# Patient Record
Sex: Female | Born: 1940 | Race: Black or African American | Hispanic: No | Smoking: Never smoker
Health system: Southern US, Community
[De-identification: ages and names within clinical notes are randomized; demographics above are authoritative.]

## PROBLEM LIST (undated history)

## (undated) DIAGNOSIS — R0602 Shortness of breath: Secondary | ICD-10-CM

## (undated) DIAGNOSIS — F419 Anxiety disorder, unspecified: Secondary | ICD-10-CM

## (undated) DIAGNOSIS — I4891 Unspecified atrial fibrillation: Secondary | ICD-10-CM

## (undated) DIAGNOSIS — IMO0001 Reserved for inherently not codable concepts without codable children: Secondary | ICD-10-CM

## (undated) DIAGNOSIS — Z950 Presence of cardiac pacemaker: Secondary | ICD-10-CM

## (undated) DIAGNOSIS — I2510 Atherosclerotic heart disease of native coronary artery without angina pectoris: Secondary | ICD-10-CM

## (undated) DIAGNOSIS — E785 Hyperlipidemia, unspecified: Secondary | ICD-10-CM

## (undated) DIAGNOSIS — Z531 Procedure and treatment not carried out because of patient's decision for reasons of belief and group pressure: Secondary | ICD-10-CM

## (undated) DIAGNOSIS — T8859XA Other complications of anesthesia, initial encounter: Secondary | ICD-10-CM

## (undated) DIAGNOSIS — I219 Acute myocardial infarction, unspecified: Secondary | ICD-10-CM

## (undated) DIAGNOSIS — R001 Bradycardia, unspecified: Secondary | ICD-10-CM

## (undated) DIAGNOSIS — T4145XA Adverse effect of unspecified anesthetic, initial encounter: Secondary | ICD-10-CM

## (undated) DIAGNOSIS — D594 Other nonautoimmune hemolytic anemias: Secondary | ICD-10-CM

## (undated) HISTORY — DX: Hyperlipidemia, unspecified: E78.5

## (undated) HISTORY — PX: INSERT / REPLACE / REMOVE PACEMAKER: SUR710

## (undated) HISTORY — DX: Unspecified atrial fibrillation: I48.91

## (undated) HISTORY — DX: Anxiety disorder, unspecified: F41.9

## (undated) HISTORY — DX: Atherosclerotic heart disease of native coronary artery without angina pectoris: I25.10

## (undated) HISTORY — PX: OTHER SURGICAL HISTORY: SHX169

## (undated) HISTORY — DX: Bradycardia, unspecified: R00.1

## (undated) HISTORY — DX: Other nonautoimmune hemolytic anemias: D59.4

## (undated) HISTORY — DX: Acute myocardial infarction, unspecified: I21.9

## (undated) HISTORY — PX: HERNIA REPAIR: SHX51

---

## 2000-05-29 DIAGNOSIS — I219 Acute myocardial infarction, unspecified: Secondary | ICD-10-CM

## 2000-05-29 HISTORY — DX: Acute myocardial infarction, unspecified: I21.9

## 2000-05-29 HISTORY — DX: Other disorders of bilirubin metabolism: E80.6

## 2007-10-06 ENCOUNTER — Inpatient Hospital Stay (HOSPITAL_COMMUNITY): Admission: AD | Admit: 2007-10-06 | Discharge: 2007-10-07 | Payer: Self-pay | Admitting: Cardiovascular Disease

## 2007-10-07 ENCOUNTER — Encounter (INDEPENDENT_AMBULATORY_CARE_PROVIDER_SITE_OTHER): Payer: Self-pay | Admitting: Cardiovascular Disease

## 2008-01-05 ENCOUNTER — Inpatient Hospital Stay (HOSPITAL_COMMUNITY): Admission: EM | Admit: 2008-01-05 | Discharge: 2008-01-06 | Payer: Self-pay | Admitting: Emergency Medicine

## 2008-02-01 ENCOUNTER — Encounter: Admission: RE | Admit: 2008-02-01 | Discharge: 2008-02-01 | Payer: Self-pay | Admitting: Cardiovascular Disease

## 2008-02-07 ENCOUNTER — Ambulatory Visit (HOSPITAL_COMMUNITY): Admission: RE | Admit: 2008-02-07 | Discharge: 2008-02-09 | Payer: Self-pay | Admitting: Cardiovascular Disease

## 2008-07-19 ENCOUNTER — Encounter: Admission: RE | Admit: 2008-07-19 | Discharge: 2008-07-19 | Payer: Self-pay | Admitting: Cardiovascular Disease

## 2009-05-15 ENCOUNTER — Inpatient Hospital Stay (HOSPITAL_COMMUNITY): Admission: AD | Admit: 2009-05-15 | Discharge: 2009-05-18 | Payer: Self-pay | Admitting: Cardiovascular Disease

## 2009-06-08 ENCOUNTER — Encounter: Admission: RE | Admit: 2009-06-08 | Discharge: 2009-06-08 | Payer: Self-pay | Admitting: Cardiovascular Disease

## 2009-10-10 ENCOUNTER — Inpatient Hospital Stay (HOSPITAL_COMMUNITY): Admission: AD | Admit: 2009-10-10 | Discharge: 2009-10-13 | Payer: Self-pay | Admitting: Cardiovascular Disease

## 2009-10-10 ENCOUNTER — Ambulatory Visit: Payer: Self-pay | Admitting: Internal Medicine

## 2009-10-17 ENCOUNTER — Ambulatory Visit: Payer: Self-pay | Admitting: Internal Medicine

## 2009-10-17 ENCOUNTER — Ambulatory Visit (HOSPITAL_COMMUNITY): Admission: RE | Admit: 2009-10-17 | Discharge: 2009-10-18 | Payer: Self-pay | Admitting: Internal Medicine

## 2009-10-18 ENCOUNTER — Encounter: Payer: Self-pay | Admitting: Internal Medicine

## 2009-10-18 ENCOUNTER — Encounter (INDEPENDENT_AMBULATORY_CARE_PROVIDER_SITE_OTHER): Payer: Self-pay | Admitting: *Deleted

## 2009-10-19 ENCOUNTER — Telehealth: Payer: Self-pay | Admitting: Internal Medicine

## 2009-11-01 ENCOUNTER — Ambulatory Visit: Payer: Self-pay

## 2009-11-01 ENCOUNTER — Encounter: Payer: Self-pay | Admitting: Internal Medicine

## 2009-11-02 ENCOUNTER — Ambulatory Visit: Payer: Self-pay | Admitting: Internal Medicine

## 2009-11-02 ENCOUNTER — Ambulatory Visit: Payer: Self-pay | Admitting: Cardiovascular Disease

## 2009-11-02 DIAGNOSIS — Z950 Presence of cardiac pacemaker: Secondary | ICD-10-CM | POA: Insufficient documentation

## 2009-11-02 DIAGNOSIS — R0602 Shortness of breath: Secondary | ICD-10-CM

## 2009-11-02 DIAGNOSIS — I495 Sick sinus syndrome: Secondary | ICD-10-CM

## 2009-11-28 ENCOUNTER — Telehealth: Payer: Self-pay | Admitting: Internal Medicine

## 2009-11-28 DIAGNOSIS — M79609 Pain in unspecified limb: Secondary | ICD-10-CM

## 2009-11-29 ENCOUNTER — Ambulatory Visit: Payer: Self-pay

## 2009-11-29 ENCOUNTER — Ambulatory Visit: Payer: Self-pay | Admitting: Internal Medicine

## 2010-02-01 ENCOUNTER — Encounter (INDEPENDENT_AMBULATORY_CARE_PROVIDER_SITE_OTHER): Payer: Self-pay | Admitting: *Deleted

## 2010-04-23 ENCOUNTER — Telehealth: Payer: Self-pay | Admitting: Internal Medicine

## 2010-05-07 ENCOUNTER — Ambulatory Visit
Admission: RE | Admit: 2010-05-07 | Discharge: 2010-05-07 | Payer: Self-pay | Source: Home / Self Care | Attending: Internal Medicine | Admitting: Internal Medicine

## 2010-05-07 ENCOUNTER — Encounter: Payer: Self-pay | Admitting: Internal Medicine

## 2010-05-07 DIAGNOSIS — I2510 Atherosclerotic heart disease of native coronary artery without angina pectoris: Secondary | ICD-10-CM | POA: Insufficient documentation

## 2010-05-28 NOTE — Cardiovascular Report (Signed)
Summary: Office Visit   Office Visit   Imported By: Roderic Ovens 11/05/2009 10:59:47  _____________________________________________________________________  External Attachment:    Type:   Image     Comment:   External Document

## 2010-05-28 NOTE — Progress Notes (Signed)
Summary: question regarding meds  Phone Note Call from Patient Call back at Home Phone [phone] Call back at [phone]   Caller: Patient Reason for Call: Talk to Nurse Summary of Call: has question regarding Oxycodone AP 5/325 one to two tablets by mouth q.6 h. p.r.n. Initial call taken by: Lorne Skeens,  October 19, 2009 12:32 PM  Follow-up for Phone Call        Pt. with questions regarding oxycodone and metoprolol.  I explained to her that oxycodone was to be taken only every 6 hours as needed for pain and that metoprolol was to be taken on regular basis as prescribed.  I instructed pt for milder pain she could take extra strength tylenol as per med discharge list but that she should not take this in additon to oxycodone. Follow-up by: Dossie Arbour, RN, BSN,  October 19, 2009 12:46 PM

## 2010-05-28 NOTE — Procedures (Signed)
Summary: WOUND CHECK   Current Medications (verified): 1)  None  Allergies (verified): No Known Drug Allergies  Comments:  Nurse/Medical Assistant: Lindsay [name] c/o increasing SOB and fatigue since the implant along with soreness of the site and left arm.  Pacemaker function within normal limits.  No edema or redness of the site or left arm noted.  At the time of discharge she was to start Metoprolol but never filled the Rx.  Her blood pressure today was 127/76.  She will return tomorrow 11/02/09 to follow up with Dr. Graciela Husbands. Lindsay Harm, LPN (November 01, 1608 12:56 PM)  PPM Specifications Following MD:  Lindsay Manges, MD     PPM Vendor:  Medtronic     PPM Model Number:  [id number]     PPM Serial Number:  [id number] Lindsay [name] PPM DOI:  10/17/2009     PPM Implanting MD:  Lindsay Manges, MD  Lead 1    Location: RA     DOI: 10/17/2009     Model #: 1191     Serial #: [id number]     Status: active Lead 2    Location: RV     DOI: 10/17/2009     Model #: [id number]     Serial #: [id number]     Status: active  Magnet Response Rate:  BOL 85 ERI 65  Indications:  Sick sinus syndrome   PPM Follow Up Battery Voltage:  3.10 V       PPM Device Measurements Atrium  Amplitude: 2.7 mV, Impedance: 536 ohms, Threshold: 1.0 V at 0.4 msec Right Ventricle  Amplitude: 14.3 mV, Impedance: 584 ohms, Threshold: 1.0 V at 0.4 msec  Episodes MS Episodes:  0     Percent Mode Switch:  0     Ventricular High Rate:  0     Atrial Pacing:  75.8%     Ventricular Pacing:  0.2%  Parameters Mode:  MVP     Lower Rate Limit:  60     Upper Rate Limit:  130 Paced AV Delay:  180     Sensed AV Delay:  150 Tech Comments:  PT IN FOR DEVICE CHECK--SEE ABOVE NOTE.  NORMAL DEVICE FUNCTION.  NO CHANGES MADE.  PT SCHEDULED TO SEE SK 11-02-09 @ 930.  PT AWARE OF APPT AND ALSO AWARE IF FEELING WORSE CALL 911 AND GO TO ER.  Lindsay [name]  November 01, 2009 1:01 PM

## 2010-05-28 NOTE — Assessment & Plan Note (Signed)
Summary: [email or web address].amber   Referring Provider:  Algie Coffer   History of Present Illness: Lindsay [name] is seen following pacer last week with SOB with some pleurisy  Lindsay [name] is no edema  Current Medications (verified): 1)  Tylenol Extra Strength 500 Mg Tabs (Acetaminophen) .... As Needed For Pain  Allergies (verified): No Known Drug Allergies  Vital Signs:  Patient profile:   70 year old female Height:      69 inches Weight:      190 pounds BMI:     28.16 O2 Sat:      100 % on Room air Pulse rate:   76 / minute Pulse rhythm:   regular BP sitting:   130 / 86  (left arm) Cuff size:   regular  Vitals Entered By: Judithe Modest CMA (November 02, 2009 9:00 AM)  O2 Flow:  Room air  Physical Exam  General:  mild resp distress Head:  nl heent Neck:  flat neck veins Lungs:  clear Heart:  RRR with uout murmurs or gallups Abdomen:  soft non tender Extremities:  no CC edema  Neurologic:  a ox3   EKG  Procedure date:  11/02/2009  Findings:      atrial paced Intervals 0.23 5.09/0.40 No acute ST-T changes Q Waves V1 V2-old  PPM Specifications Following MD:  Sherryl Manges, MD     PPM Vendor:  Medtronic     PPM Model Number:  [id number]     PPM Serial Number:  [id number] PPM DOI:  10/17/2009     PPM Implanting MD:  Sherryl Manges, MD  Lead 1    Location: RA     DOI: 10/17/2009     Model #: [id number]     Serial #: [id number]     Status: active Lead 2    Location: RV     DOI: 10/17/2009     Model #: 5621     Serial #: [id number]     Status: active  Magnet Response Rate:  BOL 85 ERI 65  Indications:  Sick sinus syndrome   Impression & Recommendations:  Problem # 1:  DYSPNEA (ICD-786.05)  the patient has significant dyspnea following her recent hospitalization. The differential diagnosis includes primarily cardiac perforation and cardiac tamponade which I do not see on physical examination, or pulmonary embolism. We will undertake a CT scan with contrast to evaluate the above.  chest x-ray  demonstrated no cardiomegaly but did show some revascularization. CT Scan he showed no pulmonary embolism and no effusion was reported. I discussed with Dr. Arnetha Courser the situation. We will measure a BNP and begin her on Lasix 40 she will followup with him by telephone in the morning Orders: CT Scan  (CT Scan) TLB-BNP (B-Natriuretic Peptide) (83880-BNPR)  Her updated medication list for this problem includes:    Furosemide 40 Mg Tabs (Furosemide) .Marland Kitchen... Take one tablet by mouth daily.  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 This test can show several things, including, whether the heart is enlarged; whether fluid is building up in the lungs; and whether pacemaker / defibrillator leads are still in place. 2)  Non-Cardiac CT scanning, (CAT scanning), is a noninvasive, special x-ray that produces cross-sectional images of the body using x-rays and a computer. CT scans help physicians  diagnose and treat medical conditions. For some CT exams, a contrast material is used to enhance visibility in the area of the body being studied. CT scans provide greater clarity and reveal more details than regular x-ray exams. 3)  Your physician recommends that you return for lab work today. 4)  Your physician has recommended you make the following change in your medication: Start Furosemide 40mg  1 tablet daily. 5)  Call Dr Algie Coffer in the morning to let him know how you are doing. Prescriptions: FUROSEMIDE 40 MG TABS (FUROSEMIDE) Take one tablet by mouth daily.  #30 x 0   Entered by:   Optometrist BSN   Authorized by:   Nathen May, MD, [facility]   Signed by:   Gypsy Balsam RN BSN on 11/02/2009   Method used:   Electronically to        [facility]  (retail)       [address]       [place], [place]  [address]       Ph: [phone]       Fax: [phone]   RxID:   [id number]

## 2010-05-28 NOTE — Progress Notes (Signed)
Summary: pain since procedure in june  Phone Note Call from Patient   Caller: Patient Reason for Call: Talk to Nurse Summary of Call: pt still having pain since procedure in june-upper arm, near shoulder sometimes shooting down to her leg, not at incision site-saw pcp told her to take pain med, she doesn't want to keep doing that pls advise [phone] Initial call taken by: Glynda Jaeger,  November 28, 2009 11:29 AM  Follow-up for Phone Call        I called and spoke with the pt. She states that since her PPM implant in June, she has had pain in her left shoulder that radiates to her neck. The pain will go down her arm to her fingers. She does not notice any tingling, but she does notice some numbness to her hand and her had may feel clammy. The left hand always feels a little cooler than the right. She denies any edema to her arm. She states she saw Dr. Algie Coffer recently and he told her she was fine, but that she should take her pain medication. She does not want to keep taking her pain meds. I will review with Dr. [name] Kill (DOD) and call the pt back. She is agreeable. Follow-up by: Sherri Rad, RN, BSN,  November 28, 2009 11:51 AM  Additional Follow-up for Phone Call Additional follow up Details #1::        I have reviewed the above with Dr. Gala Romney. Orders received to have the pt come tomorrow for an upper extremity venous doppler and a chest x-ray. I have left a message for the pt to call. Sherri Rad, RN, BSN  November 28, 2009 4:09 PM   I spoke with the pt. She is agreeable with coming tomorrow for a venous US and chest x-ray. She is aware her appts. will start at 12:30pm. Additional Follow-up by: Sherri Rad, RN, BSN,  November 28, 2009 4:27 PM  New Problems: ARM PAIN, LEFT (ICD-729.5)   New Problems: ARM PAIN, LEFT (ICD-729.5)

## 2010-05-28 NOTE — Miscellaneous (Signed)
Summary: Device preload  Clinical Lists Changes  Observations: Added new observation of PPM INDICATN: Sick sinus syndrome (10/18/2009 16:09) Added new observation of MAGNET RTE: BOL 85 ERI 65 (10/18/2009 16:09) Added new observation of PPMLEADSTAT2: active (10/18/2009 16:09) Added new observation of PPMLEADSER2: [id number] (10/18/2009 16:09) Added new observation of PPMLEADMOD2: 5086  (10/18/2009 16:09) Added new observation of PPMLEADLOC2: RV  (10/18/2009 16:09) Added new observation of PPMLEADSTAT1: active  (10/18/2009 16:09) Added new observation of PPMLEADSER1: [id number]  (10/18/2009 16:09) Added new observation of PPMLEADMOD1: 5086  (10/18/2009 16:09) Added new observation of PPMLEADLOC1: RA  (10/18/2009 16:09) Added new observation of PPM IMP MD: Sherryl Manges, MD  (10/18/2009 16:09) Added new observation of PPMLEADDOI2: 10/17/2009  (10/18/2009 16:09) Added new observation of PPMLEADDOI1: 10/17/2009  (10/18/2009 16:09) Added new observation of PPM DOI: 10/17/2009  (10/18/2009 16:09) Added new observation of PPM SERL#: [id number]  (10/18/2009 16:09) Added new observation of PPM MODL#: [id number]  (10/18/2009 08:65) Added new observation of PACEMAKERMFG: Medtronic  (10/18/2009 16:09) Added new observation of PACEMAKER MD: Sherryl Manges, MD  (10/18/2009 16:09)      PPM Specifications Following MD:  Sherryl Manges, MD     PPM Vendor:  Medtronic     PPM Model Number:  [id number]     PPM Serial Number:  [id number] PPM DOI:  10/17/2009     PPM Implanting MD:  Sherryl Manges, MD  Lead 1    Location: RA     DOI: 10/17/2009     Model #: 4401     Serial #: [id number]     Status: active Lead 2    Location: RV     DOI: 10/17/2009     Model #: 4034     Serial #: [id number]     Status: active  Magnet Response Rate:  BOL 85 ERI 65  Indications:  Sick sinus syndrome

## 2010-05-28 NOTE — Letter (Signed)
Summary: Appointment - Missed  [facility], Main Office  [address]   [place], [place] [address]   Phone: [phone]  Fax: [phone]     February 01, 2010 MRN: [id number]   [facility] [address] [place], [place]  [address]   Dear Ms. [employer],  Our records indicate you missed your appointment on 01-24-10 with Dr.  Graciela Husbands .                                    It is very important that we reach you to reschedule this appointment. We look forward to participating in your health care needs. Please contact us at the number listed above at your earliest convenience to reschedule this appointment.     Sincerely,    Glass blower/designer

## 2010-05-28 NOTE — Miscellaneous (Signed)
Summary: Orders Update  Clinical Lists Changes  Orders: Added new Test order of T-2 View CXR (71020TC) - Signed 

## 2010-05-30 NOTE — Procedures (Signed)
Summary: Cardiology Device Clinic   Current Medications (verified): 1)  Tylenol Extra Strength 500 Mg Tabs (Acetaminophen) .... As Needed For Pain 2)  Furosemide 40 Mg Tabs (Furosemide) .... Take One Tablet By Mouth Daily.  Allergies (verified): No Known Drug Allergies   PPM Specifications Following MD:  Sherryl Manges, MD     PPM Vendor:  Medtronic     PPM Model Number:  [id number]     PPM Serial Number:  [id number] PPM DOI:  10/17/2009     PPM Implanting MD:  Sherryl Manges, MD  Lead 1    Location: RA     DOI: 10/17/2009     Model #: 1308     Serial #: [id number]     Status: active Lead 2    Location: RV     DOI: 10/17/2009     Model #: 9528     Serial #: [id number]     Status: active  Magnet Response Rate:  BOL 85 ERI 65  Indications:  Sick sinus syndrome   PPM Follow Up Remote Check?  No Battery Voltage:  3.02 V     Pacer Dependent:  No       PPM Device Measurements Atrium  Amplitude: 2.2 mV, Impedance: 488 ohms, Threshold: 0.5 V at 0.4 msec Right Ventricle  Amplitude: 10.8 mV, Impedance: 512 ohms, Threshold: 1.0 V at 0.4 msec  Episodes MS Episodes:  0     Percent Mode Switch:  0     Coumadin:  No Ventricular High Rate:  2     Atrial Pacing:  70.8%     Ventricular Pacing:  0.2%  Parameters Mode:  MVP     Lower Rate Limit:  60     Upper Rate Limit:  130 Paced AV Delay:  180     Sensed AV Delay:  150 Next Cardiology Appt Due:  09/27/2010 Tech Comments:  Outputs reprogrammed for chronic thresholds.  Atrial sensitivity reprogrammed 0.60mV for FFRW.  2VHR episodes 1 second duration 1:1 conduction.  ROV 6/12 with Dr. Graciela Husbands. [name], LPN  May 07, 2010 11:31 AM

## 2010-05-30 NOTE — Assessment & Plan Note (Signed)
Summary: Pacemaker pain/shob   Visit Type:  Follow-up Referring Provider:  Algie [name]  CC:  chest pain / shob.  History of Present Illness: Primary Electrophysiologist:  Dr. Sherryl [name] Primary Cardiologist: Dr. Lahoma Crocker [name] is a 70 yo female with a h/o CAD s/p MI in 2006 treated with a BMS to the LAD followed by a DES to the LAD in 2007 2/2 in stent restenosis.  Last echo in 2009 demonstrated EF 55%, mild MR.  Last nuclear study in 04/2009 demonstrated no ischemia and EF 55%.  Last cath in 09/2009 demonstrated LAD 20% at stent; D2 70%; pCFX 50%; OM 20-30%; RCA 50-60% then 40-50%; EF 50-55%.  She is followed by Dr. Algie [name].  She is s/p pacer implantation 09/2009 due to symptomatic sinus bradycardia.  She saw Dr. Graciela [name] in 10/2009 due to dyspnea.  CXR was ok at that time and CT was negative for pulmonary embolism.  Her BNP was normal at that time at 53.  Lasix was started then.  She called back with continued pain around pacer site and LUE u/s was negative for DVT.  A repeat CXR was also unremarkable.    She was in [place] in Oct and Nov 2011.  She returned to the [place] and was admitted to [facility] in [place], [place] in Dec. 2011.  She states she had several tests and she was told she did not have a "blood clot in her lungs."  She was told she has "fatty tissue in her lungs."  She originally sought care for a URI and was eventually sent home with antibiotics.  She has had chest pain off and on since her cath in 09/2009.  There has been no change.  She has occasional dyspnea.  This can occur at rest or with exertion.  She no longer takes lasix. She denies any further cough.  No pleuritic chest pain.  No syncope.  No fevers.  She sometimes has to sleep on extra pillows and sometimes notes PND.  No increased edema.    She mainly comes in to see [place] today because of continued pain over her pacer site and left shoulder pain.  This is constant and worse with movement.  This has not improved over  time.  She denies complete loss of ROM.  Current Medications (verified): 1)  Tylenol Extra Strength 500 Mg Tabs (Acetaminophen) .... As Needed For Pain 2)  Furosemide 40 Mg Tabs (Furosemide) .... Take One Tablet By Mouth Daily.  Allergies (verified): No Known Drug Allergies  Past History:  Past Medical History: Sinus bradycardia   a. 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  All other systems reviewed and negative.   Vital Signs:  Patient profile:   70 year old female Height:      69 inches Weight:      196 pounds BMI:     29.05 Pulse rate:   62 / minute BP sitting:   118 / 82  (right arm) Cuff size:   regular  Vitals Entered By: Lindsay [name] CNA (May 07, 2010 11:25 AM)  Physical Exam  General:  Well nourished, well developed, in no acute distress HEENT: normal Neck: no JVD Cardiac:  normal S1, S2; RRR; no murmur Lungs:  clear to auscultation bilaterally, no wheezing, rhonchi or rales Abd:  soft, nontender, no hepatomegaly Ext: no edema Vascular: no carotid  bruits Skin: warm and dry Neuro:  CNs 2-12 intact, no focal abnormalities noted MSK: no deformity of left shoulder; no crepitus; no pain over AC joint or biceps tendon; empty can test produces some pain Chest: pacer site without erythema or discharge   EKG  Procedure date:  05/07/2010  Findings:      atrial paced Heart rate 62 Normal axis Nonspecific ST-T wave changes  PPM Specifications Following MD:  Lindsay Manges, MD     PPM Vendor:  Medtronic     PPM Model Number:  [id number]     PPM Serial Number:  [id number] PPM DOI:  10/17/2009     PPM Implanting MD:  Lindsay Manges, MD  Lead 1    Location: RA     DOI: 10/17/2009     Model #: 4098     Serial #: [id number]     Status: active Lead 2    Location: RV     DOI: 10/17/2009     Model #: 5621     Serial #: [id number]     Status:  active  Magnet Response Rate:  BOL 85 ERI 65  Indications:  Sick sinus syndrome   Parameters Mode:  MVP     Lower Rate Limit:  60     Upper Rate Limit:  130 Paced AV Delay:  180     Sensed AV Delay:  150  Impression & Recommendations:  Problem # 1:  ARM PAIN, LEFT (ICD-729.5) She has had an extensive workup in the past.  She has had a chest CT and the left upper extremity ultrasound as well as 2 chest x-rays.  Her exam is benign.  Her pacemaker is working appropriately.  At this point, I suspect she has some type of shoulder pathology creating her symptoms.  I discussed her case with Dr. Gala [name].  We do not recommend any further testing from a cardiac standpoint.  She will be referred to orthopedics.  Orders: Misc. Referral (Misc. Ref)  Problem # 2:  DYSPNEA (ICD-786.05) She does not appear to have signs or symptoms of congestive heart failure on exam.  Her EKG is unchanged.  She has followup with her primary cardiologist tomorrow.  She should keep that appointment.  She apparently was to be referred to a pulmonologist when she was in [place].  She needs to obtain a copy of her records.  We will refer her to primary care.  Further workup can be pursued with primary care and her primary cardiologist.  Orders: Primary Care Referral (Primary)  Problem # 3:  CORONARY ATHEROSCLEROSIS NATIVE CORONARY ARTERY (ICD-414.01) As noted, she will followup with her primary care cardiologist tomorrow. Of note, she had a heart catheterization done in June 2011.  This demonstrated stable anatomy and she was treated medically.  Problem # 4:  CARDIAC PACEMAKER IN SITU (ICD-V45.01) As noted, her pacemaker is functioning appropriately.  Her pacemaker site looks stable without signs of infection.  Other Orders: EKG w/ Interpretation (93000)  Patient Instructions: 1)  Your physician recommends that you schedule a follow-up appointment. To see Dr. Algie [name] tomorrow as scheduled.  2)  Your physician  recommends that you continue on your current medications as directed. Please refer to the Current Medication list given to you today. 3)  You have been referred to follow up with a  primary care doctor. You have also been referred to an Orthopedic specialist for your arm pain.  4)  You will see Dr. Graciela [name] in 6 months. We will send you a reminder in the mail. Your pacemaker device was checked today.

## 2010-05-30 NOTE — Progress Notes (Signed)
Summary: pain around pacermaker  Phone Note Call from Patient Call back at Home Phone [phone]   Caller: Patient Reason for Call: Talk to Nurse Summary of Call: pt states has pain where the pacer maker. pt also has sob. Initial call taken by: Roe Coombs,  April 23, 2010 9:29 AM  Follow-up for Phone Call        spoke to daughter in law and pt in [place]. cannot get a description or rating of pain. Per daughter in law, pt to return to area initially at the end of January and now wants to come back sooner. She also states that pt is not honest about how bad she hurts. Adv her that if pt hurts that bad and shob to seek emergency care. She stated that pt did not want to be seen in both places by two different doctors. Since first appt w.Dr. Graciela Husbands in Feb, offered to have her see Tereso Newcomer and she accepted.  Follow-up by: Claris Gladden RN,  April 23, 2010 4:52 PM

## 2010-06-05 NOTE — Cardiovascular Report (Signed)
Summary: Office Visit   Office Visit   Imported By: Roderic Ovens 05/29/2010 13:52:41  _____________________________________________________________________  External Attachment:    Type:   Image     Comment:   External Document

## 2010-06-26 ENCOUNTER — Ambulatory Visit: Payer: Self-pay | Admitting: Internal Medicine

## 2010-07-14 LAB — CBC
HCT: 34.3 % — ABNORMAL LOW (ref 36.0–46.0)
HCT: 37.2 % (ref 36.0–46.0)
Hemoglobin: 13.1 g/dL (ref 12.0–15.0)
MCHC: 34.6 g/dL (ref 30.0–36.0)
MCHC: 34.7 g/dL (ref 30.0–36.0)
MCHC: 34.1 g/dL (ref 30.0–36.0)
MCHC: 34.4 g/dL (ref 30.0–36.0)
MCHC: 34.6 g/dL (ref 30.0–36.0)
MCV: 96 fL (ref 78.0–100.0)
MCV: 96.2 fL (ref 78.0–100.0)
MCV: 96 fL (ref 78.0–100.0)
Platelets: 198 10*3/uL (ref 150–400)
Platelets: 217 10*3/uL (ref 150–400)
RBC: 3.41 MIL/uL — ABNORMAL LOW (ref 3.87–5.11)
RBC: 4 MIL/uL (ref 3.87–5.11)
RDW: 13.7 % (ref 11.5–15.5)
WBC: 6.1 10*3/uL (ref 4.0–10.5)
WBC: 6.3 10*3/uL (ref 4.0–10.5)

## 2010-07-14 LAB — CARDIAC PANEL(CRET KIN+CKTOT+MB+TROPI)
CK, MB: 1.1 ng/mL (ref 0.3–4.0)
CK, MB: 1.2 ng/mL (ref 0.3–4.0)
Relative Index: INVALID (ref 0.0–2.5)
Relative Index: INVALID (ref 0.0–2.5)
Total CK: 100 U/L (ref 7–177)
Total CK: 77 U/L (ref 7–177)
Troponin I: 0.01 ng/mL (ref 0.00–0.06)
Troponin I: 0.03 ng/mL (ref 0.00–0.06)
Troponin I: 0.03 ng/mL (ref 0.00–0.06)

## 2010-07-14 LAB — APTT: aPTT: 28 seconds (ref 24–37)

## 2010-07-14 LAB — BASIC METABOLIC PANEL
BUN: 13 mg/dL (ref 6–23)
BUN: 14 mg/dL (ref 6–23)
BUN: 13 mg/dL (ref 6–23)
CO2: 24 mEq/L (ref 19–32)
CO2: 25 mEq/L (ref 19–32)
CO2: 21 mEq/L (ref 19–32)
CO2: 26 mEq/L (ref 19–32)
Calcium: 8.9 mg/dL (ref 8.4–10.5)
Calcium: 9.3 mg/dL (ref 8.4–10.5)
Calcium: 9.6 mg/dL (ref 8.4–10.5)
Chloride: 110 mEq/L (ref 96–112)
Chloride: 111 mEq/L (ref 96–112)
Chloride: 111 mEq/L (ref 96–112)
Creatinine, Ser: 1.23 mg/dL — ABNORMAL HIGH (ref 0.4–1.2)
Creatinine, Ser: 1.24 mg/dL — ABNORMAL HIGH (ref 0.4–1.2)
Creatinine, Ser: 1.48 mg/dL — ABNORMAL HIGH (ref 0.4–1.2)
GFR calc Af Amer: 42 mL/min — ABNORMAL LOW (ref >60)
GFR calc Af Amer: 51 mL/min — ABNORMAL LOW (ref >60)
GFR calc Af Amer: 52 mL/min — ABNORMAL LOW (ref >60)
GFR calc non Af Amer: 47 mL/min — ABNORMAL LOW (ref >60)
GFR calc non Af Amer: 35 mL/min — ABNORMAL LOW (ref >60)
Glucose, Bld: 83 mg/dL (ref 70–99)
Glucose, Bld: 87 mg/dL (ref 70–99)
Glucose, Bld: 98 mg/dL (ref 70–99)
Potassium: 3.5 mEq/L (ref 3.5–5.1)
Potassium: 3.7 mEq/L (ref 3.5–5.1)
Potassium: 4 mEq/L (ref 3.5–5.1)
Sodium: 140 mEq/L (ref 135–145)
Sodium: 139 mEq/L (ref 135–145)
Sodium: 140 mEq/L (ref 135–145)

## 2010-07-14 LAB — PROTIME-INR
INR: 1.05 (ref 0.00–1.49)
Prothrombin Time: 13.6 seconds (ref 11.6–15.2)

## 2010-07-14 LAB — LIPID PANEL
Cholesterol: 162 mg/dL (ref 0–200)
HDL: 50 mg/dL (ref >39)
LDL Cholesterol: 100 mg/dL — ABNORMAL HIGH (ref 0–99)
Triglycerides: 94 mg/dL (ref <150)

## 2010-07-14 LAB — DIFFERENTIAL
Basophils Relative: 1 % (ref 0–1)
Basophils Relative: 1 % (ref 0–1)
Eosinophils Absolute: 0.1 10*3/uL (ref 0.0–0.7)
Eosinophils Absolute: 0.1 10*3/uL (ref 0.0–0.7)
Eosinophils Relative: 2 % (ref 0–5)
Eosinophils Relative: 2 % (ref 0–5)
Lymphocytes Relative: 40 % (ref 12–46)
Lymphs Abs: 2.8 10*3/uL (ref 0.7–4.0)
Monocytes Absolute: 0.5 10*3/uL (ref 0.1–1.0)
Monocytes Relative: 7 % (ref 3–12)
Monocytes Relative: 8 % (ref 3–12)
Neutro Abs: 3.4 10*3/uL (ref 1.7–7.7)
Neutrophils Relative %: 50 % (ref 43–77)

## 2010-07-14 LAB — CK TOTAL AND CKMB (NOT AT ARMC): CK, MB: 1.4 ng/mL (ref 0.3–4.0)

## 2010-09-03 ENCOUNTER — Inpatient Hospital Stay (HOSPITAL_COMMUNITY)
Admission: EM | Admit: 2010-09-03 | Discharge: 2010-09-05 | DRG: 313 | Disposition: A | Discharge status: Home / Self Care | Payer: Medicare Other | Source: Ambulatory Visit | Attending: Cardiovascular Disease | Admitting: Cardiovascular Disease

## 2010-09-03 ENCOUNTER — Emergency Department (HOSPITAL_COMMUNITY): Payer: Medicare Other

## 2010-09-03 DIAGNOSIS — Z7902 Long term (current) use of antithrombotics/antiplatelets: Secondary | ICD-10-CM

## 2010-09-03 DIAGNOSIS — I2510 Atherosclerotic heart disease of native coronary artery without angina pectoris: Secondary | ICD-10-CM | POA: Diagnosis present

## 2010-09-03 DIAGNOSIS — Z7982 Long term (current) use of aspirin: Secondary | ICD-10-CM

## 2010-09-03 DIAGNOSIS — E785 Hyperlipidemia, unspecified: Secondary | ICD-10-CM | POA: Diagnosis present

## 2010-09-03 DIAGNOSIS — R0789 Other chest pain: Principal | ICD-10-CM | POA: Diagnosis present

## 2010-09-03 DIAGNOSIS — Z9861 Coronary angioplasty status: Secondary | ICD-10-CM

## 2010-09-03 LAB — CBC
Hemoglobin: 12.2 g/dL (ref 12.0–15.0)
MCH: 32 pg (ref 26.0–34.0)
MCHC: 34.6 g/dL (ref 30.0–36.0)
RDW: 13.2 % (ref 11.5–15.5)

## 2010-09-03 LAB — BASIC METABOLIC PANEL
Calcium: 9.7 mg/dL (ref 8.4–10.5)
Chloride: 108 mEq/L (ref 96–112)
Creatinine, Ser: 1.11 mg/dL (ref 0.4–1.2)
GFR calc Af Amer: 59 mL/min — ABNORMAL LOW (ref >60)
Sodium: 141 mEq/L (ref 135–145)

## 2010-09-03 LAB — DIFFERENTIAL
Basophils Absolute: 0 10*3/uL (ref 0.0–0.1)
Basophils Relative: 0 % (ref 0–1)
Monocytes Relative: 9 % (ref 3–12)
Neutro Abs: 3.7 10*3/uL (ref 1.7–7.7)
Neutrophils Relative %: 53 % (ref 43–77)

## 2010-09-03 LAB — CK TOTAL AND CKMB (NOT AT ARMC): Total CK: 100 U/L (ref 7–177)

## 2010-09-03 LAB — HEPARIN LEVEL (UNFRACTIONATED): Heparin Unfractionated: 0.31 IU/mL (ref 0.30–0.70)

## 2010-09-03 LAB — TROPONIN I: Troponin I: <0.3 ng/mL (ref <0.30)

## 2010-09-03 LAB — POCT CARDIAC MARKERS: Myoglobin, poc: 85.6 ng/mL (ref 12–200)

## 2010-09-04 LAB — BASIC METABOLIC PANEL
CO2: 26 mEq/L (ref 19–32)
Chloride: 108 mEq/L (ref 96–112)
GFR calc Af Amer: 51 mL/min — ABNORMAL LOW (ref >60)
Sodium: 140 mEq/L (ref 135–145)

## 2010-09-04 LAB — CBC
HCT: 35 % — ABNORMAL LOW (ref 36.0–46.0)
MCHC: 32.9 g/dL (ref 30.0–36.0)
MCV: 93.3 fL (ref 78.0–100.0)
Platelets: 215 10*3/uL (ref 150–400)
RDW: 13.4 % (ref 11.5–15.5)

## 2010-09-04 LAB — CARDIAC PANEL(CRET KIN+CKTOT+MB+TROPI)
CK, MB: 1.7 ng/mL (ref 0.3–4.0)
Relative Index: INVALID (ref 0.0–2.5)
Total CK: 91 U/L (ref 7–177)
Troponin I: <0.3 ng/mL (ref <0.30)

## 2010-09-04 LAB — LIPID PANEL
Cholesterol: 179 mg/dL (ref 0–200)
HDL: 50 mg/dL (ref >39)
Total CHOL/HDL Ratio: 3.6 RATIO
Triglycerides: 95 mg/dL (ref <150)
VLDL: 19 mg/dL (ref 0–40)

## 2010-09-05 LAB — CBC
HCT: 37.7 % (ref 36.0–46.0)
Hemoglobin: 12.6 g/dL (ref 12.0–15.0)
MCH: 31 pg (ref 26.0–34.0)
MCV: 92.9 fL (ref 78.0–100.0)
RBC: 4.06 MIL/uL (ref 3.87–5.11)

## 2010-09-05 LAB — HEPARIN LEVEL (UNFRACTIONATED): Heparin Unfractionated: 0.69 IU/mL (ref 0.30–0.70)

## 2010-09-07 NOTE — Discharge Summary (Signed)
NAME:  Lindsay [name], Lindsay [name]            ACCOUNT NO.:  [id number]  MEDICAL RECORD NO.:  [id number]           PATIENT TYPE:  I  LOCATION:  3738                         FACILITY:  [facility]  PHYSICIAN:  Ricki Rodriguez, M.D.  DATE OF BIRTH:  1940-05-04  DATE OF ADMISSION:  09/03/2010 DATE OF DISCHARGE:  09/05/2010                              DISCHARGE SUMMARY   FINAL DIAGNOSES: 1. Chest pain. 2. Coronary artery disease. 3. Status post angioplasty of coronary vessel. 4. Hyperlipidemia.  DISCHARGE MEDICATIONS: 1. Metoprolol succinate 25 mg one daily. 2. Oxycodone 5/325 mg one or two tablets daily as needed. 3. Potassium chloride 10 mEq one daily. 4. Aspirin 81 mg daily. 5. Meclizine 12.5 mg a day. 6. Nexium 40 mg daily. 7. Nitroglycerin 0.4 mg sublingual tablet one under the tongue every 5     minutes as needed up to 3 doses as needed. 8. Plavix 75 mg daily. 9. Simvastatin 20 mg one in the evening.  DISCHARGE DIET:  Low-sodium heart-healthy diet.  DISCHARGE ACTIVITY:  The patient to increase activity as tolerated.  CONDITION ON DISCHARGE:  Improved.  FOLLOWUP:  By Dr. Orpah Cobb in 2 weeks.  HISTORY:  This 70 year old black female presented with a sudden onset of chest pain described as sharp and nonradiating without any nausea or vomiting but with sweating spell.  PHYSICAL EXAMINATION:  GENERAL:  The patient is a well-built and well- nourished female in mild distress. VITAL SIGNS:  Temperature 98, pulse 62, respirations 20, blood pressure 164/99. HEENT:  The patient is normocephalic and atraumatic with dark brown eyes.  Conjunctivae pink.  Sclerae white.  Throat pink.  The patient wears reading glasses and has dentures, but does not wear them due to poor fitting. NECK:  No JVD, no carotid bruit. LUNGS:  Clear bilaterally.  Chest wall slightly tender on palpation over the left precordium. HEART:  Normal S1 and S2. ABDOMEN:  Soft and nontender. EXTREMITIES:  No edema,  cyanosis, or clubbing. NEUROLOGIC:  Cranial nerves grossly intact.  Bilateral equal grips.  The patient is right-handed. SKIN:  Warm and dry.  LABORATORY DATA:  Normal hemoglobin/hematocrit, WBC count, platelet count.  Near normal electrolytes except potassium low at 3.2, normal BUN, creatinine, and glucose.  Subsequent potassium was 4.0, magnesium level 2.6.  CK-MB, troponin I negative x3.    EKG showed normal sinus rhythm with possible septal infarct.    Chest x-ray, no active cardiopulmonary disease.  HOSPITAL COURSE:  The patient was admitted to telemetry unit. Myocardial infarction was ruled out.  Her previous cardiac catheterization in June 2011 and nuclear stress test in January 2011 were reviewed.  The patient also claimed she had recent nuclear stress test done in a hospital in [place].  She refused further testing and had significant improvement with oral analgesic.  She was discharged home in satisfactory condition and will have outpatient testing as and when the patient is ready.  She was also advised to get mammogram done.     Ricki Rodriguez, M.D.     ASK/MEDQ  D:  09/05/2010  T:  09/05/2010  Job:  [id number]  Electronically Signed  by Orpah Cobb M.D. on 09/07/2010 02:23:37 PM

## 2010-09-10 NOTE — Discharge Summary (Signed)
NAME:  Lindsay [name], Lindsay [name]            ACCOUNT NO.:  [id number]   MEDICAL RECORD NO.:  [id number]          PATIENT TYPE:  INP   LOCATION:  2023                         FACILITY:  [facility]   PHYSICIAN:  Ricki Rodriguez, M.D.  DATE OF BIRTH:  12-Dec-1940   DATE OF ADMISSION:  01/05/2008  DATE OF DISCHARGE:  01/06/2008                               DISCHARGE SUMMARY   FINAL DIAGNOSES:  1. Coronary artery disease, native vessel.  2. Chest pain.  3. Status post angioplasty of coronary vessels.  4. Anxiety.  5. Hyperlipidemia.   DISCHARGE MEDICATIONS:  1. Nitroglycerin 0.4 mg tablet one sublingual every 5 minutes x3 as      needed for chest pain and as directed.  2. Aspirin 81 mg one daily in the morning.  3. Plavix 75 mg one daily in the morning.  4. Nexium 40 mg one daily in the morning.  5. Multivitamin one daily in the evening.  6. Simvastatin 20 mg one daily in the evening.  7. Amlodipine 2.5 mg one daily in the morning.   FOLLOWUP:  By Dr. Orpah Cobb in 1-2 weeks.  The patient is to call [phone] for appointment.   DISCHARGE DIET:  Low-sodium heart-healthy diet.   DISCHARGE ACTIVITY:  The patient to increase activity slowly.   HISTORY:  This is a 70 year old black female with known history of  coronary artery disease, had chest pain x 2 days, described as heaviness  across the chest, and also had one-sided headache starting from the back  of the neck.  Her stress test 3 months ago was without any reversible  ischemia.   PAST MEDICAL HISTORY:  Positive for myocardial infarction with  angioplasty x2 in last 3-4 years.   PHYSICAL EXAMINATION:  VITAL SIGNS:  Pulse 48, respirations 22, blood  pressure 149/91, temperature 97.4, and weight 185 pounds.  GENERAL:  The patient is averagely built and nourished black female in  mild distress.  HEENT:  The patient is normocephalic and atraumatic with brown eyes.  Pupils equally reactive to light.  Extraocular movement intact.  Ear,  nose, and throat grossly unremarkable.  NECK:  Supple.  LYMPHADENOPATHY:  None.  LUNGS:  Clear to auscultation.  HEART:  Normal S1 and S2.  No S3, grade 2/6 systolic murmur.  ABDOMEN:  Soft and nontender.  EXTREMITIES:  No edema, cyanosis, or clubbing.  NEUROLOGICALLY:  The patient was alert and oriented x 3.  Cranial nerves  grossly intact and the patient is a right-handed.   LABORATORY DATA:  Normal hemoglobin, hematocrit, WBC count, and platelet  count.  Normal electrolytes, BUN, and creatinine.  Normal CK-MB and  troponin I.   EKG was sinus bradycardia with first-degree AV block.   CT of the brain was without any acute intracranial abnormality.   HOSPITAL COURSE:  The patient was admitted to telemetry unit.  Myocardial infarction was ruled out.  She was given choice of cardiac  catheterization followed by permanent pacemaker placement.  However, the  patient to decline both the procedures.  She requested me to discharge  her home, and she will follow up  with me in 2 weeks.  At which time, she  may have made up her mind to decide which procedure she will go for.  In  the meantime, we will hold beta-blocker due to her bradycardia and first-  degree AV block, and she was started on generic simvastatin at lower  dose then in the past, presently has not taken those medications which I  am sure it was prescribed at some time. She was discharged home in  satisfactory condition.      Ricki Rodriguez, M.D.  Electronically Signed     ASK/MEDQ  D:  01/06/2008  T:  01/07/2008  Job:  [id number]

## 2010-09-10 NOTE — H&P (Signed)
NAME:  Lindsay [name], Lindsay [name] NO.:  [id number]   MEDICAL RECORD NO.:  [id number]          PATIENT TYPE:  INP   LOCATION:  3729                         FACILITY:  [facility]   PHYSICIAN:  Ricki Rodriguez, M.D.  DATE OF BIRTH:  Apr 25, 1941   DATE OF ADMISSION:  10/06/2007  DATE OF DISCHARGE:                              HISTORY & PHYSICAL   CHIEF COMPLAINT:  Chest pain.   HISTORY OF PRESENT ILLNESS:  This 70 year old black female presented to  the office with shortness of breath and occasional sharp chest pain.  She also admitted to some weight gain.  She has a history of myocardial  infarction in 2006 with a stent placement in left anterior descending  coronary artery.  A year later, she had a drug eluting Cypher stent  placed for recurrent stenosis.   PAST MEDICAL HISTORY:  Negative for diabetes.  Positive for myocardial  infarction.  Negative for hypertension.  Negative for smoking, alcohol  intake, or drug use.   PAST SURGICAL HISTORY:  The patient had duodenal ulcer and left inguinal  hernia repair.   MEDICATIONS:  1. Tylenol 500 mg 2 daily as needed.  2. Aspirin 81 mg 1 daily.  3. Nexium 40 mg 1 daily.  4. Plavix 75 mg 1 daily.   ALLERGIES:  No known drug allergy.   PERSONAL HISTORY:  The patient is widowed, works as a Chief Strategy Officer,  took care of husband last 4 years before he died in 08/11/07.   FAMILY HISTORY:  Mother died of diabetes mellitus at age 55.  Father not  known.  She had 6 sisters, all are living and 4 brothers, 3 of whom have  died, one died in auto accident, only one brother is living.   REVIEW OF SYSTEMS:  No history of asthma, COPD, pneumonia, or positive  history of chest pain.  No palpitation.  Positive history of GI bleed.  No history of kidney stones.  No cerebrovascular accident.  No seizures.  Psychiatric admissions.  Positive history of joint pains.   PHYSICAL EXAMINATION:  VITAL SIGNS:  Pulse 51, respirations 14, blood  pressure  95/55, height 5 feet 9 inches, weight 187 pounds, and BMI 27.5.  GENERAL:  The patient is well-built, well-nourished black female in mild  respiratory distress.  HEENT:  The patient is normocephalic atraumatic, has brown eyes.  Conjunctivae pink.  Sclerae white.  Throat pink.  The patient wears  reading glasses and has full upper denture, but does not wear them due  to difficulty in breathing.  NECK:  No JVD.  No bruits.  Has full range of motion.  LUNGS:  Clear bilaterally.  HEART:  Normal S1, S2.  ABDOMEN:  Soft and nontender.  EXTREMITY:  Trace edema.  No cyanosis or clubbing.  NEUROLOGIC:  Cranial nerves grossly intact.  Bilateral equal grips.  The  patient is right handed.  SKIN:  Warm and dry.   LABORATORY DATA:  Normal hemoglobin, hematocrit, WBC count, and platelet  count.  Normal electrolytes.  Cardiac enzymes within normal range.   EKG, sinus bradycardia, left ventricular hypertrophy with a  nonspecific  ST-T changes.   IMPRESSION:  1. Chest pain, rule out myocardial infarction.  2. Old myocardial infarction.  3. Status post stent placement.  4. Coronary artery disease.  5. Duodenal ulcer.   PLAN:  To admit the patient to telemetry bed.  Rule out MI.  Consider  nuclear stress test; 2-D echocardiogram.      Ricki Rodriguez, M.D.  Electronically Signed     ASK/MEDQ  D:  10/06/2007  T:  10/07/2007  Job:  [id number]

## 2010-09-10 NOTE — Discharge Summary (Signed)
NAME:  Lindsay [name], Lindsay [name]            ACCOUNT NO.:  [id number]   MEDICAL RECORD NO.:  [id number]          PATIENT TYPE:  INP   LOCATION:  3729                         FACILITY:  [facility]   PHYSICIAN:  Ricki Rodriguez, M.D.  DATE OF BIRTH:  12/03/1940   DATE OF ADMISSION:  10/06/2007  DATE OF DISCHARGE:  10/07/2007                               DISCHARGE SUMMARY   FINAL DIAGNOSES:  1. Chest pain.  2. Cardiac dysrhythmia.  3. Coronary atherosclerosis of native coronary vessel.  4. Chronic obstructive asthma.  5. Percutaneous transluminal coronary angioplasty status.  6. Old myocardial infarction.   DISCHARGE MEDICATIONS:  1. Aspirin 81 mg 1 daily.  2. Plavix 75 mg 1 daily.  3. Nexium 40 mg 1 daily.  4. Lisinopril 2.5 mg 1 daily.   DISCHARGE DIET:  Low-sodium, heart-healthy diet.   WOUND CARE:  Not applicable.   SPECIFIC INSTRUCTIONS:  The patient to stop any activity that causes  chest pain, shortness of breath, dizziness, sweating, or excessive  weakness.   DISCHARGE ACTIVITY:  The patient to increase activity slowly.   FOLLOWUP:  Dr. Orpah Cobb in 1 month.  The patient to call [phone]  for appointment.   HISTORY:  This 70 year old black female who presented with shortness of  breath and occasional sharp pain.  The patient had a history of  myocardial infarction in 2006, with stent placement in left anterior  descending coronary artery.  This was then followed by a drug-eluting  stent placement for recurrent stenosis.   PHYSICAL EXAMINATION:  VITAL SIGNS:  Pulse rate 51, respirations 14,  blood pressure 95/55, height 5 feet 9 inches, weight 187 pounds, and BMI  of 27.5.  GENERAL:  The patient is well-built, well-nourished black female in mild  respiratory distress.  HEENT:  The patient is normocephalic and atraumatic with brown eyes.  Conjunctivae pink.  Sclerae nonicteric.  The patient wears reading  glasses.  She has full upper dentures, but does not wear them due to  difficulty in breathing.  NECK:  No JVD.  No bruit with full range of motion.  LUNGS:  Clear bilaterally.  HEART:  Normal S1 and S2.  ABDOMEN:  Soft and nontender.  EXTREMITIES:  Trace edema.  No cyanosis or clubbing.  NEUROLOGIC:  Cranial nerves grossly intact.  Bilateral equal grips.  The  patient is right handed.   LABORATORY DATA:  Normal hemoglobin, hematocrit, WBC count, and platelet  count.  Normal electrolytes.  Cardiac enzymes within normal range.   EKG, sinus bradycardia, left ventricular hypertrophy with nonspecific ST-  T changes.  Nuclear stress test without any perfusion defect.   Echocardiogram showed pseudotendon running across the mid septum  otherwise preserved left ventricular systolic function and mild  hypokinesia of the mid septal wall and mild mitral regurgitation.   HOSPITAL COURSE:  The patient was admitted to telemetry unit.  Myocardial infarction was ruled out.  She underwent nuclear stress test  that failed to show any reversible ischemia.  Her overall condition  being stable,  she was discharged home in satisfactory condition. Her  beta-blocker therapy was not continued due  to bradycardia.      Ricki Rodriguez, M.D.  Electronically Signed     ASK/MEDQ  D:  12/27/2007  T:  12/28/2007  Job:  [id number]

## 2010-10-28 ENCOUNTER — Telehealth: Payer: Self-pay | Admitting: Internal Medicine

## 2010-10-28 NOTE — Telephone Encounter (Signed)
Pt calling re appt on 7-23, but having pain, hardness in shoulder

## 2010-10-28 NOTE — Telephone Encounter (Signed)
LMTC

## 2010-10-31 NOTE — Telephone Encounter (Signed)
LMTC

## 2010-11-01 NOTE — Telephone Encounter (Signed)
The patient's family answered at the number that was listed. He states that the patient is at a different location and he does not know the number. I have asked him to just have her give [place] a call back on Monday.

## 2010-11-12 NOTE — Telephone Encounter (Signed)
No response from the patient. I will close this encounter. 

## 2010-11-14 ENCOUNTER — Encounter: Payer: Self-pay | Admitting: Internal Medicine

## 2010-11-18 ENCOUNTER — Ambulatory Visit (INDEPENDENT_AMBULATORY_CARE_PROVIDER_SITE_OTHER): Payer: Federal, State, Local not specified - PPO | Admitting: Internal Medicine

## 2010-11-18 ENCOUNTER — Encounter: Payer: Self-pay | Admitting: Internal Medicine

## 2010-11-18 DIAGNOSIS — R040 Epistaxis: Secondary | ICD-10-CM | POA: Insufficient documentation

## 2010-11-18 DIAGNOSIS — I495 Sick sinus syndrome: Secondary | ICD-10-CM

## 2010-11-18 DIAGNOSIS — I2510 Atherosclerotic heart disease of native coronary artery without angina pectoris: Secondary | ICD-10-CM

## 2010-11-18 DIAGNOSIS — Z950 Presence of cardiac pacemaker: Secondary | ICD-10-CM

## 2010-11-18 DIAGNOSIS — M79609 Pain in unspecified limb: Secondary | ICD-10-CM

## 2010-11-18 DIAGNOSIS — I4891 Unspecified atrial fibrillation: Secondary | ICD-10-CM | POA: Insufficient documentation

## 2010-11-18 NOTE — Progress Notes (Signed)
  HPI  Lindsay [name] is a 70 y.o. female Seen in followup for pacemaker implanted June 2011 for symptomatic sinus node dysfunction. Her postoperative course was complicated by discomfort and pleuritic pain for which no specific cause was ever a loose today. The symptoms have largely but not completely abated.  She continues to have episodic dyspnea. She has had no palpitations.  She has complaints of orthostatic intolerance.  Past Medical History  Diagnosis Date  . Sinus bradycardia     s/p pacer in 6.2011  . CAD (coronary artery disease)     s/p BMS to LAD in 06  . MI (myocardial infarction) 2/2  . 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 Anxiety   . HLD (hyperlipidemia)     Past Surgical History  Procedure Date  . Angioplasty of coronary vessel     Current Outpatient Prescriptions  Medication Sig Dispense Refill  . acetaminophen (TYLENOL) 500 MG tablet Take 500 mg by mouth as needed.        Marland Kitchen aspirin 81 MG tablet Take 81 mg by mouth daily.        . clopidogrel (PLAVIX) 75 MG tablet Take 75 mg by mouth daily.        . nitroGLYCERIN (NITROSTAT) 0.4 MG SL tablet Place 0.4 mg under the tongue every 5 (five) minutes as needed.        Marland Kitchen esomeprazole (NEXIUM) 40 MG capsule Take 40 mg by mouth as needed.         No Known Allergies  Review of Systems negative except from HPI and PMH  Physical Exam Well developed and well nourished in no acute distress HENT normal E scleral and icterus clear Neck Supple JVP flat; carotids brisk and full Clear to ausculation Device pocket is well healed Regular rate and rhythm, no murmurs gallops or rub Soft with active bowel sounds No clubbing cyanosis and edema Somewhat limited mobility of her left shoulder Alert and oriented, grossly normal motor and sensory function Skin Warm and Dry    Assessment and  Plan

## 2010-11-18 NOTE — Assessment & Plan Note (Addendum)
Much improved. I suggested that she continue range of motion exercises For her left arm

## 2010-11-18 NOTE — Patient Instructions (Signed)
Your physician wants you to follow-up in: 6 months  You will receive a reminder letter in the mail two months in advance. If you don't receive a letter, please call our office to schedule the follow-up appointment.  Your physician recommends that you continue on your current medications as directed. Please refer to the Current Medication list given to you today.  

## 2010-11-18 NOTE — Assessment & Plan Note (Signed)
Ongoing problems with nonexertional likely noncardiac chest pain area and she does not take her Plavix regularly but she is now 3 years out and perhaps does not need it any longer. I will defer this to Dr. Arnetha Courser. She has had positive at epistaxis and she does not take her Plavix.

## 2010-11-18 NOTE — Assessment & Plan Note (Signed)
This is been identified on her pacemaker is less than One days duration. We'll need to follow this as her CHADS VASC score would be an indication for oral anticoagulation see below

## 2010-11-18 NOTE — Assessment & Plan Note (Signed)
Patient has epistaxis in the setting of her Plavix therapy and she doesn't take it. I recommended that she followup with Dr. Arnetha Courser for consideration of ENT referral as anticoagulation will clearly need to be part of her future

## 2010-11-18 NOTE — Assessment & Plan Note (Signed)
75% atrially paced

## 2010-11-18 NOTE — Assessment & Plan Note (Signed)
The patient's device was interrogated.  The information was reviewed. No changes were made in the programming.    

## 2011-01-07 ENCOUNTER — Other Ambulatory Visit: Payer: Self-pay | Admitting: Cardiovascular Disease

## 2011-01-07 DIAGNOSIS — Z1231 Encounter for screening mammogram for malignant neoplasm of breast: Secondary | ICD-10-CM

## 2011-01-10 ENCOUNTER — Ambulatory Visit
Admission: RE | Admit: 2011-01-10 | Discharge: 2011-01-10 | Disposition: A | Discharge status: Home / Self Care | Payer: Federal, State, Local not specified - PPO | Source: Ambulatory Visit | Attending: Cardiovascular Disease | Admitting: Cardiovascular Disease

## 2011-01-10 DIAGNOSIS — Z1231 Encounter for screening mammogram for malignant neoplasm of breast: Secondary | ICD-10-CM

## 2011-01-16 ENCOUNTER — Ambulatory Visit (INDEPENDENT_AMBULATORY_CARE_PROVIDER_SITE_OTHER): Payer: Federal, State, Local not specified - PPO | Admitting: Internal Medicine

## 2011-01-16 ENCOUNTER — Emergency Department (HOSPITAL_COMMUNITY): Payer: Medicare Other

## 2011-01-16 ENCOUNTER — Encounter: Payer: Self-pay | Admitting: Internal Medicine

## 2011-01-16 ENCOUNTER — Inpatient Hospital Stay (HOSPITAL_COMMUNITY)
Admission: EM | Admit: 2011-01-16 | Discharge: 2011-01-17 | DRG: 313 | Disposition: A | Discharge status: Home / Self Care | Payer: Medicare Other | Source: Ambulatory Visit | Attending: Cardiovascular Disease | Admitting: Cardiovascular Disease

## 2011-01-16 DIAGNOSIS — E7800 Pure hypercholesterolemia, unspecified: Secondary | ICD-10-CM | POA: Diagnosis present

## 2011-01-16 DIAGNOSIS — I2510 Atherosclerotic heart disease of native coronary artery without angina pectoris: Secondary | ICD-10-CM | POA: Diagnosis present

## 2011-01-16 DIAGNOSIS — Z950 Presence of cardiac pacemaker: Secondary | ICD-10-CM

## 2011-01-16 DIAGNOSIS — Z79899 Other long term (current) drug therapy: Secondary | ICD-10-CM

## 2011-01-16 DIAGNOSIS — I10 Essential (primary) hypertension: Secondary | ICD-10-CM | POA: Diagnosis present

## 2011-01-16 DIAGNOSIS — I4891 Unspecified atrial fibrillation: Secondary | ICD-10-CM

## 2011-01-16 DIAGNOSIS — K219 Gastro-esophageal reflux disease without esophagitis: Secondary | ICD-10-CM | POA: Diagnosis present

## 2011-01-16 DIAGNOSIS — Z9861 Coronary angioplasty status: Secondary | ICD-10-CM

## 2011-01-16 DIAGNOSIS — Z7902 Long term (current) use of antithrombotics/antiplatelets: Secondary | ICD-10-CM

## 2011-01-16 DIAGNOSIS — R0602 Shortness of breath: Secondary | ICD-10-CM

## 2011-01-16 DIAGNOSIS — R0789 Other chest pain: Principal | ICD-10-CM | POA: Diagnosis present

## 2011-01-16 DIAGNOSIS — Z7982 Long term (current) use of aspirin: Secondary | ICD-10-CM

## 2011-01-16 DIAGNOSIS — I252 Old myocardial infarction: Secondary | ICD-10-CM

## 2011-01-16 LAB — CBC
Hemoglobin: 13.4 g/dL (ref 12.0–15.0)
MCH: 32.2 pg (ref 26.0–34.0)
RBC: 4.16 MIL/uL (ref 3.87–5.11)

## 2011-01-16 LAB — DIFFERENTIAL
Basophils Relative: 1 % (ref 0–1)
Eosinophils Absolute: 0.1 10*3/uL (ref 0.0–0.7)
Lymphs Abs: 2.8 10*3/uL (ref 0.7–4.0)
Neutrophils Relative %: 43 % (ref 43–77)

## 2011-01-16 LAB — BASIC METABOLIC PANEL
BUN: 15 mg/dL (ref 6–23)
Chloride: 109 mEq/L (ref 96–112)
GFR calc Af Amer: 52 mL/min — ABNORMAL LOW (ref >60)
Potassium: 3.1 mEq/L — ABNORMAL LOW (ref 3.5–5.1)

## 2011-01-16 LAB — TROPONIN I: Troponin I: <0.3 ng/mL (ref <0.30)

## 2011-01-16 LAB — PACEMAKER DEVICE OBSERVATION
BAMS-0001: 170 {beats}/min
BATTERY VOLTAGE: 3 V
RV LEAD AMPLITUDE: 14.3 mv

## 2011-01-16 LAB — CK TOTAL AND CKMB (NOT AT ARMC)
Relative Index: 2.1 (ref 0.0–2.5)
Total CK: 105 U/L (ref 7–177)

## 2011-01-16 LAB — POCT I-STAT TROPONIN I: Troponin i, poc: 0.02 ng/mL (ref 0.00–0.08)

## 2011-01-16 NOTE — Assessment & Plan Note (Signed)
No significant AFb

## 2011-01-16 NOTE — Assessment & Plan Note (Signed)
The patient's device was interrogated.  The information was reviewed. No changes were made in the programming.    

## 2011-01-16 NOTE — Assessment & Plan Note (Signed)
There is no evidence of an acute ST segment elevation MI. I am concerned about acute coronary syndrome. We'll send her to the emergency room

## 2011-01-16 NOTE — Progress Notes (Signed)
  HPI  Lindsay [name] is a 70 y.o. female Seen as an add-on today because of chest pain and shortness of breath. She saw dr Arnetha Courser last week who increased her NTG   She has continued to have symptoms however . He has had increasing problems with breathing. She is short of breath walking as well as talking. She's had left-sided chest pain with radiation into her arm. This is reminiscent of symptoms that she had prior to her stenting. She has had no peripheral edema. She is taking no long trips. She called our office because she thought maybe it was her pacemaker. This visit was arranged She has a pacemaker implanted June 2011 for symptomatic sinus node dysfunction. Her postoperative course was complicated by discomfort and pleuritic pain for which no specific cause was ever a loose today. The symptoms have largely but not completely abated.  S   Past Medical History  Diagnosis Date  . Sinus bradycardia     s/p pacer in 6.2011  . CAD (coronary artery disease)     s/p BMS to LAD in 06; Drug-eluting stent for in-stent restenosis 2007; modest residual disease cath 2011  . MI (myocardial infarction) 2/2  . 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 Anxiety   . HLD (hyperlipidemia)   . Atrial fibrillation     Identified on pacemaker     Past Surgical History  Procedure Date  . Angioplasty of coronary vessel     Current Outpatient Prescriptions  Medication Sig Dispense Refill  . acetaminophen (TYLENOL) 500 MG tablet Take 500 mg by mouth as needed.        . clopidogrel (PLAVIX) 75 MG tablet Take 75 mg by mouth daily.        Marland Kitchen esomeprazole (NEXIUM) 40 MG capsule Take 40 mg by mouth as needed.       . isosorbide mononitrate (IMDUR) 30 MG 24 hr tablet Take 30 mg by mouth daily.        . nitroGLYCERIN (NITROSTAT) 0.4 MG SL tablet Place 0.4 mg under the tongue every 5 (five) minutes as needed.          No Known Allergies  Review of Systems negative except from HPI and PMH  Physical Exam Well  developed and well nourished in no acute distress HENT normal E scleral and icterus clear Neck Supple JVP flat; carotids brisk and full Clear to ausculation Regular rate and rhythm, no murmurs gallops or rub Soft with active bowel sounds No clubbing cyanosis and edema Alert and oriented, grossly normal motor and sensory function Skin Warm and Dry  ECG atrial paced  rhythm at 62 Intervals 0.23/0.09/0.40 LVH Atrial pacing Otherwise normal Assessment and  Plan

## 2011-01-16 NOTE — Assessment & Plan Note (Signed)
She has a significant increase in her exertional dyspnea accompanied by chest pain. I'm not sure of the mechanism of this. Her pacemaker function is normal. I spoke with Dr. Roseanne Kaufman office. He is not in town. We will send her to the emergency room for further evaluation.

## 2011-01-17 ENCOUNTER — Inpatient Hospital Stay (HOSPITAL_COMMUNITY): Payer: Medicare Other

## 2011-01-17 LAB — COMPREHENSIVE METABOLIC PANEL
Albumin: 3.1 g/dL — ABNORMAL LOW (ref 3.5–5.2)
Alkaline Phosphatase: 74 U/L (ref 39–117)
BUN: 15 mg/dL (ref 6–23)
Chloride: 109 mEq/L (ref 96–112)
GFR calc Af Amer: 51 mL/min — ABNORMAL LOW (ref >60)
Glucose, Bld: 87 mg/dL (ref 70–99)
Potassium: 3.6 mEq/L (ref 3.5–5.1)
Total Bilirubin: 0.8 mg/dL (ref 0.3–1.2)

## 2011-01-17 LAB — PROTIME-INR
INR: 1.12 (ref 0.00–1.49)
Prothrombin Time: 14.6 seconds (ref 11.6–15.2)

## 2011-01-17 LAB — LIPID PANEL
Cholesterol: 175 mg/dL (ref 0–200)
VLDL: 22 mg/dL (ref 0–40)

## 2011-01-17 LAB — CBC
MCHC: 34.5 g/dL (ref 30.0–36.0)
RDW: 13.4 % (ref 11.5–15.5)

## 2011-01-17 LAB — CARDIAC PANEL(CRET KIN+CKTOT+MB+TROPI)
CK, MB: 2.6 ng/mL (ref 0.3–4.0)
Troponin I: <0.3 ng/mL (ref <0.30)

## 2011-01-17 MED ORDER — TECHNETIUM TC 99M TETROFOSMIN IV KIT
30.0000 | PACK | Freq: Once | INTRAVENOUS | Status: AC | PRN
  Administered 2011-01-17: 30 via INTRAVENOUS

## 2011-01-17 MED ORDER — TECHNETIUM TC 99M TETROFOSMIN IV KIT
10.0000 | PACK | Freq: Once | INTRAVENOUS | Status: AC | PRN
  Administered 2011-01-17: 10 via INTRAVENOUS

## 2011-01-23 LAB — CBC
HCT: 35.2 — ABNORMAL LOW
HCT: 35.4 — ABNORMAL LOW
Hemoglobin: 12
Hemoglobin: 12.1
MCHC: 34.3
MCV: 94.1
MCV: 95
RBC: 3.73 — ABNORMAL LOW
RBC: 3.74 — ABNORMAL LOW
WBC: 7.1

## 2011-01-23 LAB — COMPREHENSIVE METABOLIC PANEL
ALT: 18
Alkaline Phosphatase: 69
CO2: 25
Calcium: 9
Chloride: 109
GFR calc non Af Amer: 41 — ABNORMAL LOW
Glucose, Bld: 78
Sodium: 138
Total Bilirubin: 0.9

## 2011-01-23 LAB — DIFFERENTIAL
Basophils Relative: 1
Eosinophils Absolute: 0.2
Lymphs Abs: 3.3
Monocytes Absolute: 0.6
Monocytes Relative: 8

## 2011-01-23 LAB — CARDIAC PANEL(CRET KIN+CKTOT+MB+TROPI)
CK, MB: 1
Relative Index: INVALID
Total CK: 109
Total CK: 62
Troponin I: 0.01

## 2011-01-23 LAB — MAGNESIUM: Magnesium: 2.6 — ABNORMAL HIGH

## 2011-01-23 LAB — LIPID PANEL: VLDL: 11

## 2011-01-23 LAB — APTT: aPTT: 30

## 2011-01-23 LAB — HEPARIN LEVEL (UNFRACTIONATED): Heparin Unfractionated: 1.11 — ABNORMAL HIGH

## 2011-01-23 NOTE — Progress Notes (Signed)
Addended by: Burnett Kanaris A on: 01/23/2011 02:32 PM   Modules accepted: Orders

## 2011-01-27 LAB — CBC
HCT: 37.9
Hemoglobin: 12.6
RBC: 3.93
RDW: 13.7

## 2011-01-27 LAB — CK TOTAL AND CKMB (NOT AT ARMC)
CK, MB: 1.3
Total CK: 68

## 2011-01-27 LAB — CARDIAC PANEL(CRET KIN+CKTOT+MB+TROPI)
Relative Index: INVALID
Total CK: 81

## 2011-01-27 LAB — BASIC METABOLIC PANEL
CO2: 23
Calcium: 9.4
GFR calc Af Amer: 51 — ABNORMAL LOW
GFR calc non Af Amer: 42 — ABNORMAL LOW
Glucose, Bld: 138 — ABNORMAL HIGH
Potassium: 3.4 — ABNORMAL LOW
Sodium: 140

## 2011-01-27 LAB — TROPONIN I: Troponin I: 0.02

## 2011-01-29 LAB — CBC
HCT: 35.7 — ABNORMAL LOW
HCT: 36.2
HCT: 37.6
Hemoglobin: 12.1
Hemoglobin: 12.4
Hemoglobin: 12.6
MCHC: 33.5
MCHC: 34.2
MCV: 94.6
MCV: 95.5
Platelets: 219
Platelets: 226
RDW: 13.3
RDW: 13.4
RDW: 13.6

## 2011-01-29 LAB — LIPID PANEL
Cholesterol: 196
HDL: 54
Total CHOL/HDL Ratio: 3.6

## 2011-01-29 LAB — BASIC METABOLIC PANEL
BUN: 15
CO2: 22
GFR calc non Af Amer: 45 — ABNORMAL LOW
Glucose, Bld: 89
Potassium: 3.7
Sodium: 140

## 2011-01-29 LAB — CK TOTAL AND CKMB (NOT AT ARMC)
CK, MB: 1
CK, MB: 1.2
Relative Index: INVALID
Total CK: 76

## 2011-01-29 LAB — PROTIME-INR: Prothrombin Time: 14.7

## 2011-01-29 LAB — TROPONIN I
Troponin I: 0.01
Troponin I: 0.02
Troponin I: 0.02

## 2011-01-29 LAB — DIFFERENTIAL
Eosinophils Absolute: 0.1
Eosinophils Relative: 1
Lymphocytes Relative: 34
Lymphs Abs: 1.5
Monocytes Absolute: 0.4
Monocytes Relative: 10

## 2011-01-29 LAB — POCT CARDIAC MARKERS: Troponin i, poc: <0.05

## 2011-02-05 NOTE — Discharge Summary (Signed)
NAME:  Lindsay [name], Lindsay [name] NO.:  [id number]  MEDICAL RECORD NO.:  [id number]  LOCATION:  3710                         FACILITY:  [facility]  PHYSICIAN:  Eduardo Osier. Sharyn Lull, M.D. DATE OF BIRTH:  1940-09-25  DATE OF ADMISSION:  01/16/2011 DATE OF DISCHARGE:  01/17/2011                              DISCHARGE SUMMARY   ADMITTING DIAGNOSES:  Chest pain rule out myocardial infarction, coronary artery disease, history of myocardial infarction in 2006, history of percutaneous transluminal coronary angioplasty  stenting to left anterior descending in the past, history of symptomatic sinus node dysfunction status post permanent pacemaker in the past, hypertension, hypercholesteremia, history of duodenal ulcer, gastroesophageal reflux disease.  DISCHARGE DIAGNOSES:  Status post chest pain myocardial infarction ruled out, negative Lexiscan Myoview, coronary artery disease, history of myocardial infarction in the past in 2006, status post percutaneous coronary intervention to left anterior descending in the past, history of symptomatic sinus node dysfunction status post permanent pacemaker in the past, hypertension, hypercholesteremia, history of duodenal ulcer, gastroesophageal reflux disease.  DISCHARGE HOME MEDICATIONS: 1. Enteric-coated aspirin 81 mg 1 tablet daily. 2. Pepcid 20 mg 1 tablet twice daily. 3. Lipitor 20 mg 1 tablet daily. 4. Toprol-XL 25 mg 1 tablet daily. 5. Tylenol 1 tablet 500 mg as needed for pain. 6. Clopidogrel 75 mg 1 tablet daily. 7. Imdur 30 mg 1 tablet daily. 8. Nitrostat sublingual 0.4 mg use as directed.  DIET:  Low salt, low cholesterol.  ACTIVITY:  As tolerated.  FOLLOWUP:  With Dr. Algie Coffer in 1 week.  CONDITION AT DISCHARGE:  Stable.  BRIEF HISTORY AND HOSPITAL COURSE:  Lindsay [name] is a 70 year old black female with past medical history significant for coronary artery disease, history of MI in the past status post PTCA stenting to LAD  in 2006, symptomatic sinus node dysfunction status post permanent pacemaker, hypertension, hypercholesteremia, history of peptic ulcer disease, GERD.  She came to the ER via EMS complaining of recurrent retrosternal and left-sided chest pain grade 6/10 radiating to the left arm associated with mild shortness of breath.  The patient received four baby aspirins and sublingual nitro in the ER with relief of chest pain. Denies any palpitation, lightheadedness or syncope.  States she gets occasional skipping of the heart beat.  Denies any history of PND, orthopnea or leg swelling.  States went to see Dr. Graciela Husbands today for pacemaker followup and was referred to the ER.  The patient denies any relation of chest pain to food, breathing or movement.  PAST MEDICAL HISTORY:  As above.  PAST SURGICAL HISTORY:  Had left inguinal hernia repair in the past, had C-section in the past, had permanent pacemaker in the past.  SOCIAL HISTORY:  She is widowed, 5 children, retired.  No history of smoking or alcohol abuse.  Worked in home health aide.  The patient was born in [place] but moved to [place] in 1985.  FAMILY HISTORY:  Positive for diabetes mellitus.  ALLERGIES:  No known drug allergies.  MEDICATION AT HOME:  She is on Imdur, Pepcid, Nexium, Nitrostat, and Tylenol.  PHYSICAL EXAMINATION:  GENERAL:  She was alert, awake, and oriented x3 in no acute distress. VITAL SIGNS:  Blood pressure was  144/93, pulse was 61, atrial paced rhythm on the monitor. EYES:  Conjunctivae was pink. NECK:  Supple.  No JVD, no bruit. LUNGS:  Clear to auscultation without rhonchi or rales. CARDIOVASCULAR:  S1 and S2 was normal.  There was a soft systolic murmur.  There was no S3 or S4 gallop. ABDOMEN:  Soft.  Bowel sounds were present, nontender. EXTREMITIES:  There is no clubbing, cyanosis or edema.  LABORATORY DATA:  Hemoglobin was 13.4, hematocrit 37.7, white count of 5.9.  Sodium was 141, potassium 3.1.   Repeat potassium this morning is 3.6, BUN was 15, creatinine 1.24, glucose was 90.  Two sets of cardiac enzymes were negative.  Cholesterol was 175, LDL 107, HDL 46, triglycerides are 161.  Chest x-ray showed no acute cardiopulmonary process.  Lexiscan Myoview showed no evidence of reversible ischemia or scar, normal wall motion with EF of 59%.  BRIEF HOSPITAL COURSE:  The patient was admitted to telemetry unit.  MI was ruled out by serial enzymes and EKG.  The patient subsequently underwent Lexiscan Myoview scan which showed no evidence of reversible ischemia or scar with normal LV function.  The patient is ambulating in hallway without any problems.  The patient will be discharged home on above medications and will be followed by Dr. Algie Coffer in 1 week.     Eduardo Osier. Sharyn Lull, M.D.     M[facility]/MEDQ  D:  01/17/2011  T:  01/18/2011  Job:  [id number]  cc:   Ricki Rodriguez, M.D.  Electronically Signed by Rinaldo Cloud M.D. on 02/05/2011 07:48:03 PM

## 2011-04-02 ENCOUNTER — Encounter: Payer: Federal, State, Local not specified - PPO | Admitting: Internal Medicine

## 2011-05-05 IMAGING — CR DG CHEST 2V
2 series · 2 of 2 positions shown · non-contrast
Comparison: 05/15/2009

CLINICAL DATA: Chest and arm pain.

CHEST - 2 VIEW

[w chest pa]
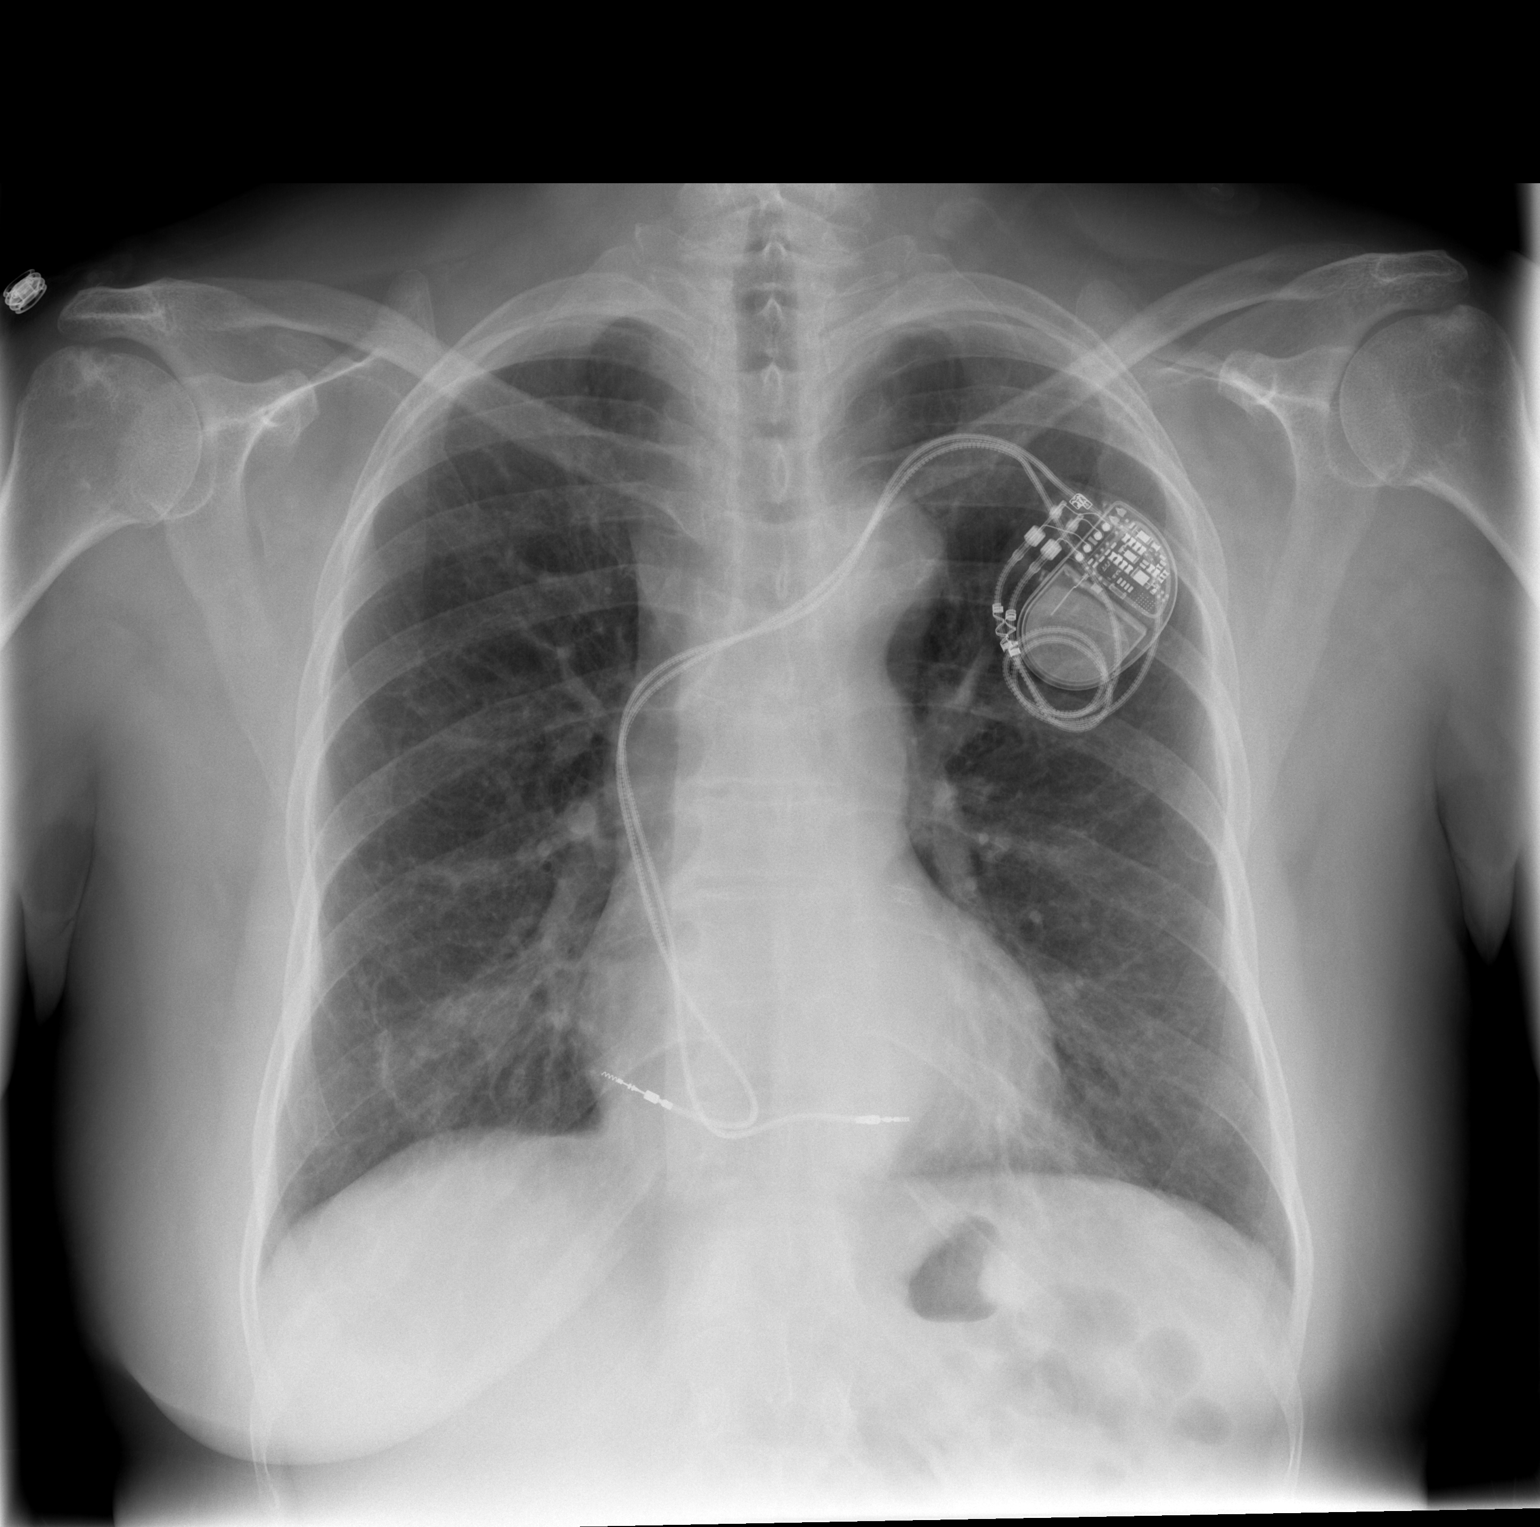

[w chest lat]
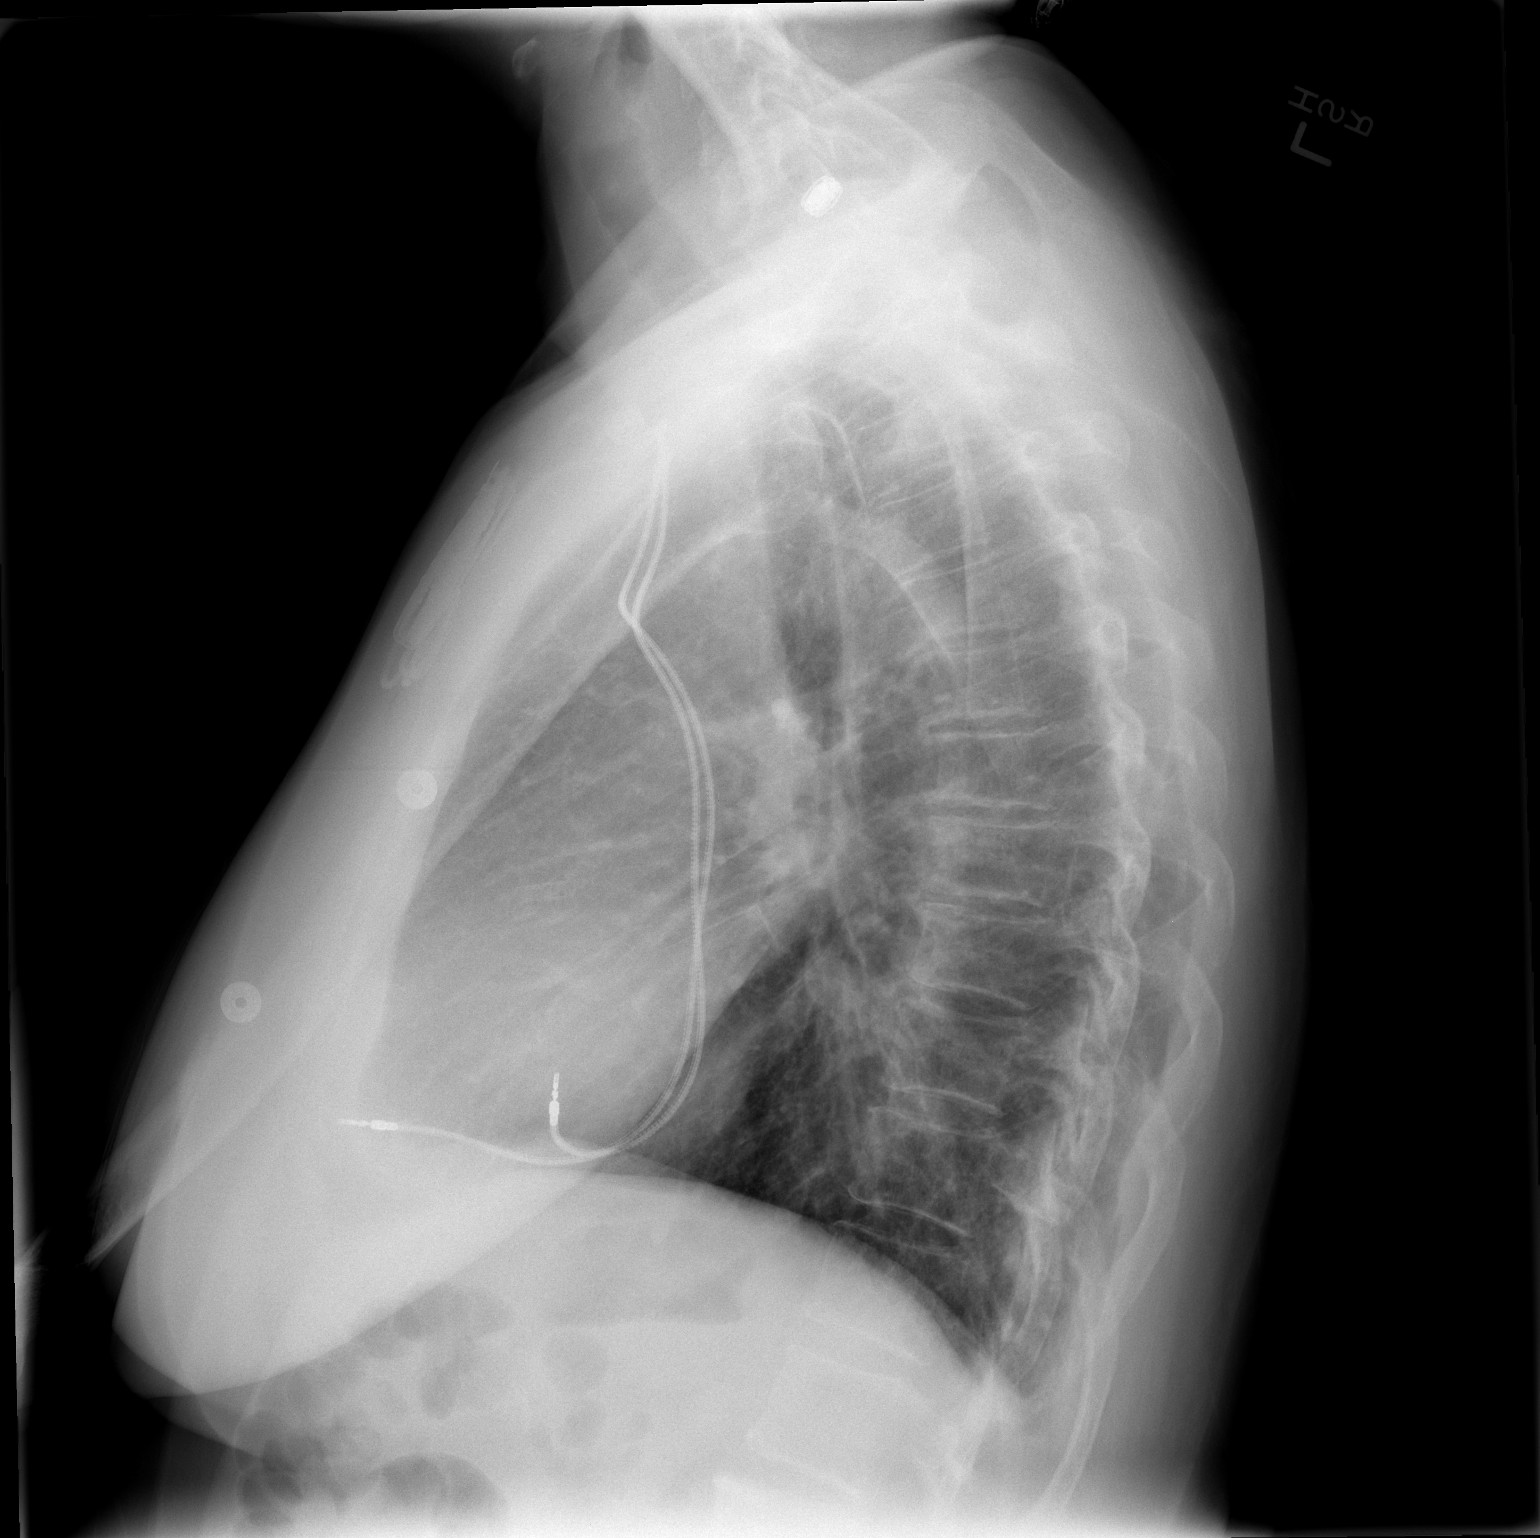

[2 of 2 positions shown; findings below may reference images not displayed]

FINDINGS: The left pacer has been placed with leads in the right
atrium and right ventricle.  No pneumothorax.  Heart is normal
size.  Lungs are clear.  No acute bony abnormality.
IMPRESSION: Left pacer placement without pneumothorax.  No active disease.

## 2011-05-06 ENCOUNTER — Encounter: Payer: Self-pay | Admitting: Internal Medicine

## 2011-06-16 IMAGING — CR DG CHEST 2V
2 series · 2 of 2 positions shown · non-contrast
Comparison: Chest x-ray of 11/02/2009

CLINICAL DATA: The arm pain, cardiac pacemaker insertion

CHEST - 2 VIEW

[view not recorded (1 of 2)]
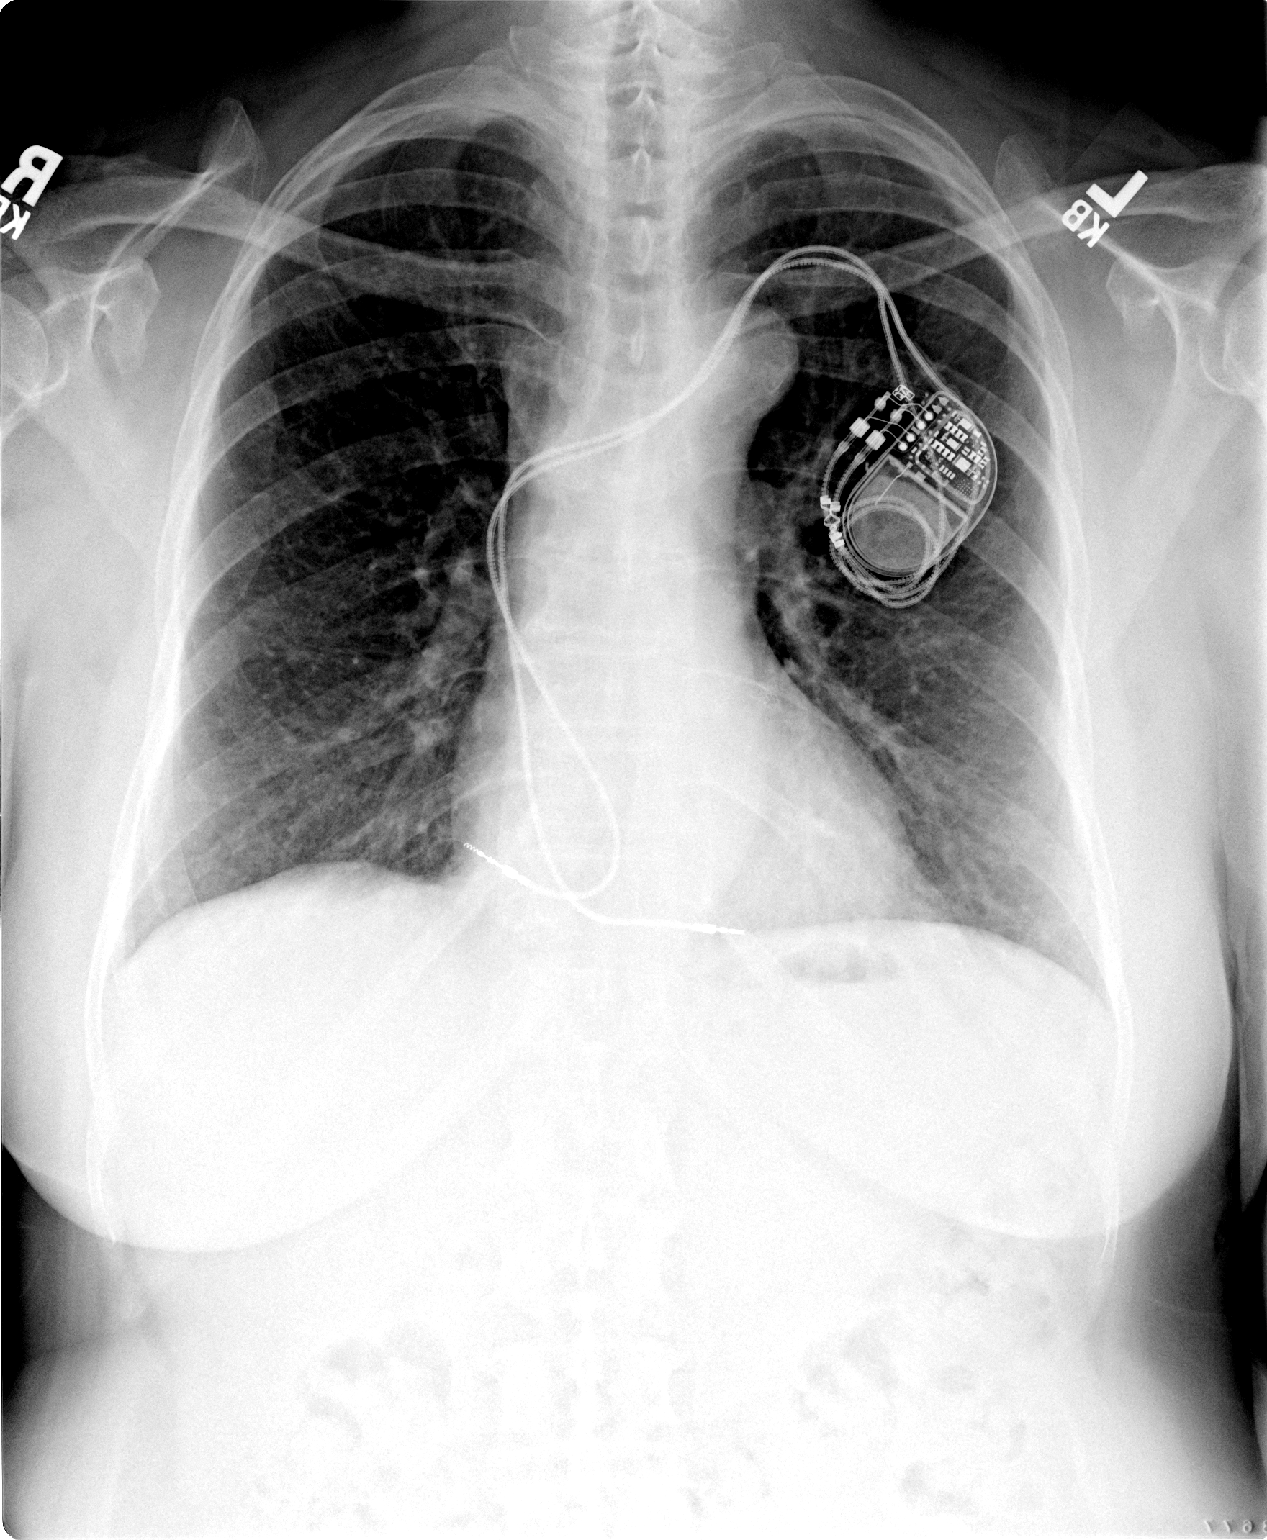

[view not recorded (2 of 2)]
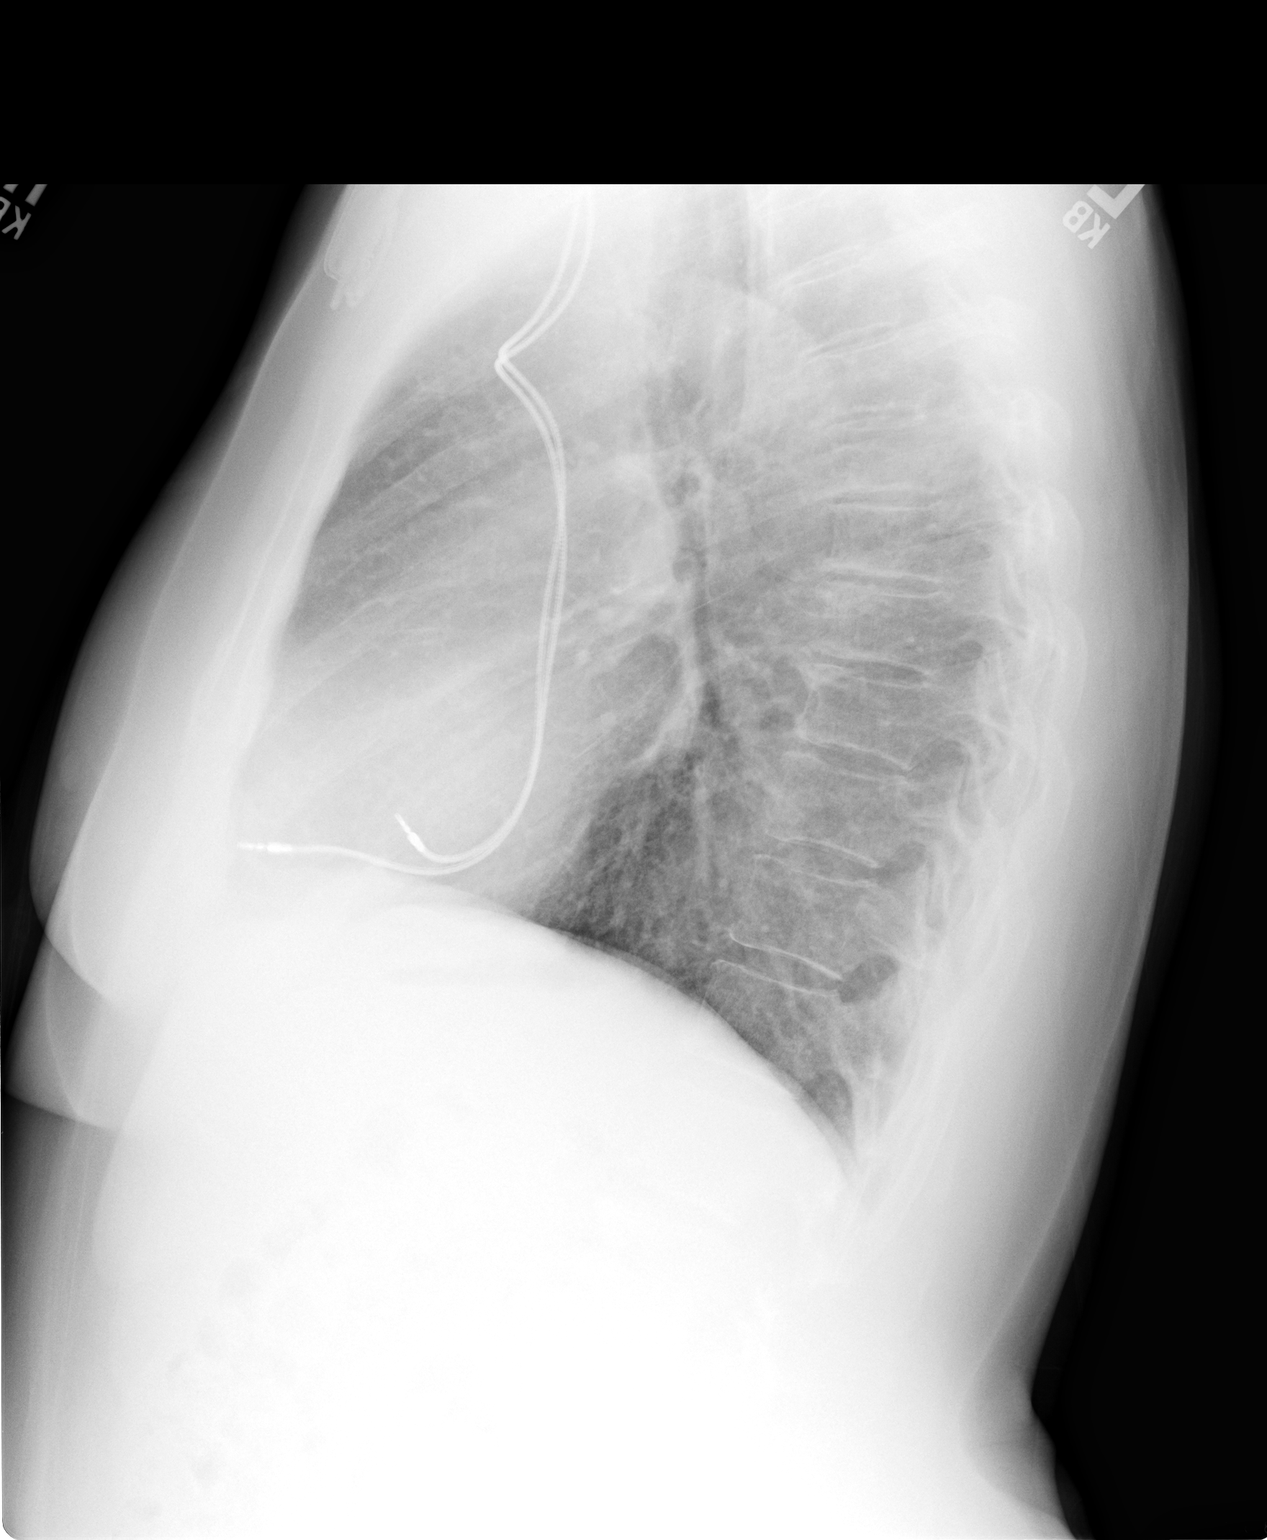

[2 of 2 positions shown; findings below may reference images not displayed]

FINDINGS: The lungs are clear.  The heart is within normal limits
in size.  A permanent pacemaker remains.  No bony abnormality is
seen.
IMPRESSION: Stable chest x-ray.  No active lung disease.

## 2011-07-29 ENCOUNTER — Encounter: Payer: Self-pay | Admitting: Internal Medicine

## 2011-07-29 ENCOUNTER — Ambulatory Visit (INDEPENDENT_AMBULATORY_CARE_PROVIDER_SITE_OTHER): Payer: Federal, State, Local not specified - PPO | Admitting: Internal Medicine

## 2011-07-29 VITALS — BP 114/72 | HR 75 | Ht 69.0 in | Wt 185.0 lb

## 2011-07-29 DIAGNOSIS — I495 Sick sinus syndrome: Secondary | ICD-10-CM

## 2011-07-29 DIAGNOSIS — I4891 Unspecified atrial fibrillation: Secondary | ICD-10-CM

## 2011-07-29 DIAGNOSIS — Z950 Presence of cardiac pacemaker: Secondary | ICD-10-CM

## 2011-07-29 LAB — PACEMAKER DEVICE OBSERVATION
AL AMPLITUDE: 2.4 mv
RV LEAD IMPEDENCE PM: 536 Ohm
RV LEAD THRESHOLD: 1 V
VENTRICULAR PACING PM: 1.16

## 2011-07-29 NOTE — Patient Instructions (Signed)
Your physician wants you to follow-up in: 6 months with Kristin/Paula for a device check & 1 year with Dr. Klein. You will receive a reminder letter in the mail two months in advance. If you don't receive a letter, please call our office to schedule the follow-up appointment.  Your physician recommends that you continue on your current medications as directed. Please refer to the Current Medication list given to you today.  

## 2011-07-29 NOTE — Progress Notes (Signed)
  HPI  Lindsay [name] is a 71 y.o. 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 She is no worse off than normal however today. Her breathing is stable.     Past Medical History  Diagnosis Date  . Sinus bradycardia     s/p pacer in 6.2011  . CAD (coronary artery disease)     s/p BMS to LAD in 06; Drug-eluting stent for in-stent restenosis 2007; modest residual disease cath 2011  . MI (myocardial infarction) 2/2  . 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 Anxiety   . HLD (hyperlipidemia)   . Atrial fibrillation     Identified on pacemaker     Past Surgical History  Procedure Date  . Angioplasty of coronary vessel     Current Outpatient Prescriptions  Medication Sig Dispense Refill  . acetaminophen (TYLENOL) 500 MG tablet Take 500 mg by mouth as needed.        Marland Kitchen aspirin 81 MG tablet Take 81 mg by mouth daily.      . clopidogrel (PLAVIX) 75 MG tablet Take 75 mg by mouth daily.        Marland Kitchen esomeprazole (NEXIUM) 40 MG capsule Take 40 mg by mouth as needed.       . isosorbide mononitrate (IMDUR) 30 MG 24 hr tablet Take 30 mg by mouth daily.        . nitroGLYCERIN (NITROSTAT) 0.4 MG SL tablet Place 0.4 mg under the tongue every 5 (five) minutes as needed.          No Known Allergies  Review of Systems negative except from HPI and PMH  Physical Exam BP 114/72  Pulse 75  Ht 5\' 9"  (1.753 m)  Wt 185 lb (83.915 kg)  BMI 27.32 kg/m2 Well developed and well nourished  appearing to be in significant discomfort although she says she is just fineHENT normal E scleral and icterus clear Neck Supple JVP flat; carotids brisk and full Clear to ausculation Regular rate and rhythm, no murmurs gallops or rub Soft with active bowel sounds No  clubbing cyanosis none Edema Alert and oriented, grossly normal motor and sensory function Skin Warm and Dry   Assessment and  Plan

## 2011-07-29 NOTE — Assessment & Plan Note (Signed)
60% atrial paced

## 2011-07-29 NOTE — Assessment & Plan Note (Signed)
inferequent afib

## 2011-07-29 NOTE — Assessment & Plan Note (Signed)
The patient's device was interrogated.  The information was reviewed. No changes were made in the programming.    

## 2011-09-11 ENCOUNTER — Encounter (HOSPITAL_COMMUNITY): Payer: Self-pay | Admitting: General Practice

## 2011-09-11 ENCOUNTER — Inpatient Hospital Stay (HOSPITAL_COMMUNITY)
Admission: AD | Admit: 2011-09-11 | Discharge: 2011-09-13 | DRG: 287 | Disposition: A | Discharge status: Home / Self Care | Payer: Medicare Other | Source: Ambulatory Visit | Attending: Cardiovascular Disease | Admitting: Cardiovascular Disease

## 2011-09-11 DIAGNOSIS — E785 Hyperlipidemia, unspecified: Secondary | ICD-10-CM | POA: Diagnosis present

## 2011-09-11 DIAGNOSIS — F411 Generalized anxiety disorder: Secondary | ICD-10-CM | POA: Diagnosis present

## 2011-09-11 DIAGNOSIS — T82897A Other specified complication of cardiac prosthetic devices, implants and grafts, initial encounter: Secondary | ICD-10-CM | POA: Diagnosis present

## 2011-09-11 DIAGNOSIS — Z531 Procedure and treatment not carried out because of patient's decision for reasons of belief and group pressure: Secondary | ICD-10-CM

## 2011-09-11 DIAGNOSIS — Y849 Medical procedure, unspecified as the cause of abnormal reaction of the patient, or of later complication, without mention of misadventure at the time of the procedure: Secondary | ICD-10-CM | POA: Diagnosis present

## 2011-09-11 DIAGNOSIS — I252 Old myocardial infarction: Secondary | ICD-10-CM

## 2011-09-11 DIAGNOSIS — R079 Chest pain, unspecified: Secondary | ICD-10-CM | POA: Diagnosis present

## 2011-09-11 DIAGNOSIS — Z950 Presence of cardiac pacemaker: Secondary | ICD-10-CM

## 2011-09-11 DIAGNOSIS — I4891 Unspecified atrial fibrillation: Secondary | ICD-10-CM | POA: Diagnosis present

## 2011-09-11 DIAGNOSIS — I2510 Atherosclerotic heart disease of native coronary artery without angina pectoris: Principal | ICD-10-CM | POA: Diagnosis present

## 2011-09-11 HISTORY — DX: Presence of cardiac pacemaker: Z95.0

## 2011-09-11 HISTORY — DX: Adverse effect of unspecified anesthetic, initial encounter: T41.45XA

## 2011-09-11 HISTORY — DX: Reserved for inherently not codable concepts without codable children: IMO0001

## 2011-09-11 HISTORY — DX: Other complications of anesthesia, initial encounter: T88.59XA

## 2011-09-11 HISTORY — DX: Procedure and treatment not carried out because of patient's decision for reasons of belief and group pressure: Z53.1

## 2011-09-11 HISTORY — DX: Shortness of breath: R06.02

## 2011-09-11 LAB — PRO B NATRIURETIC PEPTIDE: Pro B Natriuretic peptide (BNP): 81.9 pg/mL (ref 0–125)

## 2011-09-11 LAB — CBC
HCT: 36.5 % (ref 36.0–46.0)
Hemoglobin: 12.6 g/dL (ref 12.0–15.0)
MCH: 32.1 pg (ref 26.0–34.0)
RBC: 3.93 MIL/uL (ref 3.87–5.11)

## 2011-09-11 LAB — PROTIME-INR: Prothrombin Time: 14.3 seconds (ref 11.6–15.2)

## 2011-09-11 LAB — DIFFERENTIAL
Eosinophils Absolute: 0.3 10*3/uL (ref 0.0–0.7)
Lymphs Abs: 2.8 10*3/uL (ref 0.7–4.0)
Monocytes Relative: 5 % (ref 3–12)
Neutro Abs: 2.6 10*3/uL (ref 1.7–7.7)
Neutrophils Relative %: 44 % (ref 43–77)

## 2011-09-11 LAB — CARDIAC PANEL(CRET KIN+CKTOT+MB+TROPI)
CK, MB: 2 ng/mL (ref 0.3–4.0)
CK, MB: 2.1 ng/mL (ref 0.3–4.0)
Total CK: 90 U/L (ref 7–177)
Troponin I: <0.3 ng/mL (ref <0.30)

## 2011-09-11 LAB — MAGNESIUM: Magnesium: 2.4 mg/dL (ref 1.5–2.5)

## 2011-09-11 MED ORDER — ONDANSETRON HCL 4 MG/2ML IJ SOLN: 4.0000 mg | Freq: Four times a day (QID) | INTRAMUSCULAR | Status: DC | PRN

## 2011-09-11 MED ORDER — SODIUM CHLORIDE 0.9 % IJ SOLN: 3.0000 mL | INTRAMUSCULAR | Status: DC | PRN

## 2011-09-11 MED ORDER — ZOLPIDEM TARTRATE 5 MG PO TABS: 5.0000 mg | ORAL_TABLET | Freq: Every evening | ORAL | Status: DC | PRN

## 2011-09-11 MED ORDER — CLOPIDOGREL BISULFATE 75 MG PO TABS
75.0000 mg | ORAL_TABLET | Freq: Every day | ORAL | Status: DC
  Administered 2011-09-11 – 2011-09-12 (×2): 75 mg via ORAL
  Filled 2011-09-11 (×2): qty 1

## 2011-09-11 MED ORDER — ASPIRIN 300 MG RE SUPP: 300.0000 mg | RECTAL | Status: AC

## 2011-09-11 MED ORDER — SODIUM CHLORIDE 0.9 % IV SOLN: 250.0000 mL | INTRAVENOUS | Status: DC | PRN

## 2011-09-11 MED ORDER — ACETAMINOPHEN 325 MG PO TABS
650.0000 mg | ORAL_TABLET | ORAL | Status: DC | PRN
  Administered 2011-09-12: 650 mg via ORAL
  Filled 2011-09-11: qty 2

## 2011-09-11 MED ORDER — SODIUM CHLORIDE 0.9 % IJ SOLN
3.0000 mL | Freq: Two times a day (BID) | INTRAMUSCULAR | Status: DC
  Administered 2011-09-12: 3 mL via INTRAVENOUS

## 2011-09-11 MED ORDER — HEPARIN (PORCINE) IN NACL 100-0.45 UNIT/ML-% IJ SOLN
1000.0000 [IU]/h | INTRAMUSCULAR | Status: DC
  Administered 2011-09-11: 1000 [IU]/h via INTRAVENOUS
  Filled 2011-09-11 (×2): qty 250

## 2011-09-11 MED ORDER — HEPARIN BOLUS VIA INFUSION
4000.0000 [IU] | Freq: Once | INTRAVENOUS | Status: AC
  Administered 2011-09-11: 4000 [IU] via INTRAVENOUS
  Filled 2011-09-11: qty 4000

## 2011-09-11 MED ORDER — ASPIRIN 81 MG PO CHEW
324.0000 mg | CHEWABLE_TABLET | ORAL | Status: AC
  Administered 2011-09-11: 243 mg via ORAL
  Filled 2011-09-11: qty 4

## 2011-09-11 MED ORDER — ALPRAZOLAM 0.25 MG PO TABS: 0.2500 mg | ORAL_TABLET | Freq: Two times a day (BID) | ORAL | Status: DC | PRN

## 2011-09-11 MED ORDER — NITROGLYCERIN 0.4 MG SL SUBL: 0.4000 mg | SUBLINGUAL_TABLET | SUBLINGUAL | Status: DC | PRN

## 2011-09-11 MED ORDER — ASPIRIN EC 81 MG PO TBEC
81.0000 mg | DELAYED_RELEASE_TABLET | Freq: Every day | ORAL | Status: DC
  Filled 2011-09-11: qty 1

## 2011-09-11 NOTE — H&P (Signed)
Lindsay [name] is an 71 y.o. female.   Chief Complaint: Chest pain  HPI: 72 years old black female with recurrent chest pain, non-radiating, mid sternal, as heaviness. Occasional rekief with NTG and aspirin.  Past Medical History  Diagnosis Date  . Sinus bradycardia     s/p pacer in 6.2011  . CAD (coronary artery disease)     s/p BMS to LAD in 06; Drug-eluting stent for in-stent restenosis 2007; modest residual disease cath 2011  . MI (myocardial infarction) 2/2  . 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 Anxiety   . HLD (hyperlipidemia)   . Atrial fibrillation     Identified on pacemaker   . Refusal of blood transfusions as patient is Jehovah's Witness   . Shortness of breath   . Complication of anesthesia     difficult waking up "  . Pacemaker       Past Surgical History  Procedure Date  . Angioplasty of coronary vessel   . Insert / replace / remove pacemaker   . Hernia repair   . Cesarean section     History reviewed. No pertinent family history. Social History:  reports that she has never smoked. She has never used smokeless tobacco. She reports that she does not drink alcohol or use illicit drugs.  Allergies: No Known Allergies  Medications Prior to Admission  Medication Sig Dispense Refill  . acetaminophen (TYLENOL) 500 MG tablet Take 500 mg by mouth every 6 (six) hours as needed. For pain      . aspirin EC 81 MG tablet Take 81 mg by mouth daily.      . nitroGLYCERIN (NITROSTAT) 0.4 MG SL tablet Place 0.4 mg under the tongue every 5 (five) minutes x 3 doses as needed. For chest pain        No results found for this or any previous visit (from the past 48 hour(s)). No results found.  @ROS @  Blood pressure 126/75, pulse 61, temperature 98 F (36.7 C), temperature source Oral, resp. rate 20, height 5\' 9"  (1.753 m), weight 86.183 kg (190 lb), SpO2 100.00%. HEENT: The patient is normocephalic and atraumatic with dark brown eyes. Conjunctivae pink. Sclerae  white. Throat pink. The patient wears reading glasses and has dentures, but does not wear them due to poor fitting.  NECK: No JVD, no carotid bruit.  LUNGS: Clear bilaterally. Chest wall slightly tender on palpation over the left precordium.  HEART: Normal S1 and S2.  ABDOMEN: Soft and nontender.  EXTREMITIES: No edema, cyanosis, or clubbing.  NEUROLOGIC: Cranial nerves grossly intact. Bilateral equal grips. The patient is right-handed.  SKIN: Warm and dry.   Assessment/Plan Chest pain CAD Hyperlipidemia Weakness   R/O MI Lindsay [name] S 09/11/2011, 2:02 PM

## 2011-09-11 NOTE — Progress Notes (Signed)
ANTICOAGULATION CONSULT NOTE - Initial Consult  Pharmacy Consult for Heparin Indication: chest pain/ACS  No Known Allergies  Patient Measurements: Height: 5\' 9"  (175.3 cm) Weight: 190 lb (86.183 kg) IBW/kg (Calculated) : 66.2  Heparin Dosing Weight: 84 kg  Vital Signs: Temp: 98 F (36.7 C) (05/16 1200) Temp src: Oral (05/16 1200) BP: 126/75 mmHg (05/16 1200) Pulse Rate: 61  (05/16 1200)  Labs: No results found for this basename: HGB:2,HCT:3,PLT:3,APTT:3,LABPROT:3,INR:3,HEPARINUNFRC:3,CREATININE:3,CKTOTAL:3,CKMB:3,TROPONINI:3 in the last 72 hours  Estimated Creatinine Clearance: 49.1 ml/min (by C-G formula based on Cr of 1.25).   Medical History: Past Medical History  Diagnosis Date  . Sinus bradycardia     s/p pacer in 6.2011  . CAD (coronary artery disease)     s/p BMS to LAD in 06; Drug-eluting stent for in-stent restenosis 2007; modest residual disease cath 2011  . MI (myocardial infarction) 2/2  . 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 Anxiety   . HLD (hyperlipidemia)   . Atrial fibrillation     Identified on pacemaker   . Refusal of blood transfusions as patient is Jehovah's Witness   . Shortness of breath   . Complication of anesthesia     difficult waking up "  . Pacemaker     Medications:  Prescriptions prior to admission  Medication Sig Dispense Refill  . acetaminophen (TYLENOL) 500 MG tablet Take 500 mg by mouth every 6 (six) hours as needed. For pain      . aspirin EC 81 MG tablet Take 81 mg by mouth daily.      . nitroGLYCERIN (NITROSTAT) 0.4 MG SL tablet Place 0.4 mg under the tongue every 5 (five) minutes x 3 doses as needed. For chest pain        Assessment: Chest Pain, r/o MI:  To begin anticoagulation with Heparin.  Baseline CBC and coagulation studies are pending.  Goal of Therapy:  Heparin level 0.3-0.7 units/ml Monitor platelets by anticoagulation protocol: Yes   Plan:  Give 4000 units bolus x 1 Start heparin infusion at 1000  units/hr Check anti-Xa level in 8 hours and daily while on heparin Continue to monitor H&H and platelets  Estella Husk, Pharm.D., BCPS Clinical Pharmacist  Phone [phone] Pager [phone] 09/11/2011, 2:13 PM

## 2011-09-11 NOTE — Care Management Note (Unsigned)
    Page 1 of 1   09/11/2011     4:23:51 PM   CARE MANAGEMENT NOTE 09/11/2011  Patient:  Lindsay [name], Lindsay [name]   Account Number:  [id number]  Date Initiated:  09/11/2011  Documentation initiated by:  GRAVES-BIGELOW,Aaminah Forrester  Subjective/Objective Assessment:   Pt admiited with cp. Direct admit from MD office. Pt states she is from home with son. Currently on iv heparin. Plan for cath in am.     Action/Plan:   Anticipated DC Date:  09/13/2011   Anticipated DC Plan:  HOME/SELF CARE      DC Planning Services  CM consult      Choice offered to / List presented to:             Status of service:  In process, will continue to follow Medicare Important Message given?   (If response is "NO", the following Medicare IM given date fields will be blank) Date Medicare IM given:   Date Additional Medicare IM given:    Discharge Disposition:    Per UR Regulation:    If discussed at Long Length of Stay Meetings, dates discussed:    Comments:  09-11-11 1622 Tomi Bamberger, RN,BSN [phone] CM will continue to monitor for disposition needs.

## 2011-09-11 NOTE — Progress Notes (Signed)
ANTICOAGULATION CONSULT NOTE - Follow Up Consult  Pharmacy Consult for Heparin Indication: chest pain/ACS  No Known Allergies  Patient Measurements: Height: 5\' 9"  (175.3 cm) Weight: 190 lb (86.183 kg) IBW/kg (Calculated) : 66.2  Heparin Dosing Weight: 84 kg  Vital Signs: Temp: 98.8 F (37.1 C) (05/16 2100) Temp src: Oral (05/16 2100) BP: 119/69 mmHg (05/16 2100) Pulse Rate: 61  (05/16 2100)  Labs:  Basename 09/11/11 2239 09/11/11 1917 09/11/11 1506  HGB -- -- 12.6  HCT -- -- 36.5  PLT -- -- 202  APTT -- -- --  LABPROT -- -- 14.3  INR -- -- 1.09  HEPARINUNFRC 0.55 -- --  CREATININE -- -- --  CKTOTAL -- 90 89  CKMB -- 2.1 2.0  TROPONINI -- <0.30 <0.30    Estimated Creatinine Clearance: 49.1 ml/min (by C-G formula based on Cr of 1.25).   Medical History: Past Medical History  Diagnosis Date  . Sinus bradycardia     s/p pacer in 6.2011  . CAD (coronary artery disease)     s/p BMS to LAD in 06; Drug-eluting stent for in-stent restenosis 2007; modest residual disease cath 2011  . MI (myocardial infarction) 2/2  . 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 Anxiety   . HLD (hyperlipidemia)   . Atrial fibrillation     Identified on pacemaker   . Refusal of blood transfusions as patient is Jehovah's Witness   . Shortness of breath   . Complication of anesthesia     difficult waking up "  . Pacemaker     Medications:  Prescriptions prior to admission  Medication Sig Dispense Refill  . acetaminophen (TYLENOL) 500 MG tablet Take 500 mg by mouth every 6 (six) hours as needed. For pain      . aspirin EC 81 MG tablet Take 81 mg by mouth daily.      . nitroGLYCERIN (NITROSTAT) 0.4 MG SL tablet Place 0.4 mg under the tongue every 5 (five) minutes x 3 doses as needed. For chest pain        Assessment: 71 yo female with r/o ACS on IV heparin. Heparin level is at-goal on 1000 units/hr.   Goal of Therapy:  Heparin level 0.3-0.7 units/ml Monitor platelets by  anticoagulation protocol: Yes   Plan:  1. Continue IV heparin at 1000 units/hr.  2. Daily CBC, heparin level.  Lorre Munroe, PharmD 09/11/2011, 11:31 PM

## 2011-09-11 NOTE — Progress Notes (Signed)
UR Completed Shelba Susi Graves-Bigelow, RN,BSN [phone]  

## 2011-09-12 ENCOUNTER — Encounter (HOSPITAL_COMMUNITY)
Admission: AD | Disposition: A | Discharge status: Home / Self Care | Payer: Self-pay | Source: Ambulatory Visit | Attending: Cardiovascular Disease

## 2011-09-12 HISTORY — PX: LEFT HEART CATHETERIZATION WITH CORONARY ANGIOGRAM: SHX5451

## 2011-09-12 LAB — LIPID PANEL
Cholesterol: 211 mg/dL — ABNORMAL HIGH (ref 0–200)
LDL Cholesterol: 127 mg/dL — ABNORMAL HIGH (ref 0–99)
Triglycerides: 65 mg/dL (ref <150)

## 2011-09-12 LAB — CBC
MCHC: 34.6 g/dL (ref 30.0–36.0)
Platelets: 195 10*3/uL (ref 150–400)
RBC: 3.88 MIL/uL (ref 3.87–5.11)
RDW: 13 % (ref 11.5–15.5)
WBC: 5.1 10*3/uL (ref 4.0–10.5)

## 2011-09-12 LAB — HEPARIN LEVEL (UNFRACTIONATED): Heparin Unfractionated: 0.67 IU/mL (ref 0.30–0.70)

## 2011-09-12 LAB — PROTIME-INR
INR: 1 (ref 0.00–1.49)
INR: 1.03 (ref 0.00–1.49)
Prothrombin Time: 13.7 seconds (ref 11.6–15.2)

## 2011-09-12 LAB — BASIC METABOLIC PANEL
Calcium: 9.6 mg/dL (ref 8.4–10.5)
GFR calc Af Amer: 49 mL/min — ABNORMAL LOW (ref >90)
GFR calc non Af Amer: 42 mL/min — ABNORMAL LOW (ref >90)
GFR calc non Af Amer: 44 mL/min — ABNORMAL LOW (ref >90)
Potassium: 3.8 mEq/L (ref 3.5–5.1)
Potassium: 3.8 mEq/L (ref 3.5–5.1)
Sodium: 139 mEq/L (ref 135–145)
Sodium: 138 mEq/L (ref 135–145)

## 2011-09-12 LAB — CARDIAC PANEL(CRET KIN+CKTOT+MB+TROPI): Total CK: 76 U/L (ref 7–177)

## 2011-09-12 SURGERY — LEFT HEART CATHETERIZATION WITH CORONARY ANGIOGRAM
Anesthesia: LOCAL

## 2011-09-12 MED ORDER — ONDANSETRON HCL 4 MG/2ML IJ SOLN: 4.0000 mg | Freq: Four times a day (QID) | INTRAMUSCULAR | Status: DC | PRN

## 2011-09-12 MED ORDER — ASPIRIN 81 MG PO CHEW: 324.0000 mg | CHEWABLE_TABLET | ORAL | Status: DC

## 2011-09-12 MED ORDER — CLOPIDOGREL BISULFATE 75 MG PO TABS: 75.0000 mg | ORAL_TABLET | Freq: Every day | ORAL | Status: AC

## 2011-09-12 MED ORDER — AMLODIPINE BESYLATE 2.5 MG PO TABS
2.5000 mg | ORAL_TABLET | Freq: Every day | ORAL | Status: DC
  Administered 2011-09-12: 2.5 mg via ORAL
  Filled 2011-09-12 (×2): qty 1

## 2011-09-12 MED ORDER — ASPIRIN EC 81 MG PO TBEC
81.0000 mg | DELAYED_RELEASE_TABLET | Freq: Every day | ORAL | Status: DC
Start: 2011-09-13 — End: 2011-09-13
  Filled 2011-09-12: qty 1

## 2011-09-12 MED ORDER — MIDAZOLAM HCL 2 MG/2ML IJ SOLN
INTRAMUSCULAR | Status: AC
  Filled 2011-09-12: qty 2

## 2011-09-12 MED ORDER — LIDOCAINE HCL (PF) 1 % IJ SOLN
INTRAMUSCULAR | Status: AC
  Filled 2011-09-12: qty 30

## 2011-09-12 MED ORDER — SODIUM CHLORIDE 0.9 % IJ SOLN: 3.0000 mL | INTRAMUSCULAR | Status: DC | PRN

## 2011-09-12 MED ORDER — FENTANYL CITRATE 0.05 MG/ML IJ SOLN
INTRAMUSCULAR | Status: AC
  Filled 2011-09-12: qty 2

## 2011-09-12 MED ORDER — HEPARIN (PORCINE) IN NACL 2-0.9 UNIT/ML-% IJ SOLN
INTRAMUSCULAR | Status: AC
  Filled 2011-09-12: qty 2000

## 2011-09-12 MED ORDER — ASPIRIN 81 MG PO CHEW
324.0000 mg | CHEWABLE_TABLET | ORAL | Status: AC
  Administered 2011-09-12: 324 mg via ORAL
  Filled 2011-09-12: qty 4

## 2011-09-12 MED ORDER — DIAZEPAM 5 MG PO TABS
5.0000 mg | ORAL_TABLET | ORAL | Status: DC
  Filled 2011-09-12: qty 1

## 2011-09-12 MED ORDER — ACETAMINOPHEN 325 MG PO TABS: 650.0000 mg | ORAL_TABLET | ORAL | Status: DC | PRN

## 2011-09-12 MED ORDER — CLOPIDOGREL BISULFATE 75 MG PO TABS: 75.0000 mg | ORAL_TABLET | ORAL | Status: DC

## 2011-09-12 MED ORDER — SODIUM CHLORIDE 0.9 % IV SOLN: 250.0000 mL | INTRAVENOUS | Status: DC | PRN

## 2011-09-12 MED ORDER — SODIUM CHLORIDE 0.9 % IJ SOLN: 3.0000 mL | Freq: Two times a day (BID) | INTRAMUSCULAR | Status: DC

## 2011-09-12 MED ORDER — SODIUM CHLORIDE 0.9 % IV SOLN
INTRAVENOUS | Status: DC
  Administered 2011-09-12: 08:00:00 via INTRAVENOUS

## 2011-09-12 MED ORDER — SODIUM CHLORIDE 0.9 % IV SOLN: 1.0000 mL/kg/h | INTRAVENOUS | Status: AC

## 2011-09-12 MED ORDER — NITROGLYCERIN 0.2 MG/ML ON CALL CATH LAB
INTRAVENOUS | Status: AC
  Filled 2011-09-12: qty 1

## 2011-09-12 MED ORDER — DIAZEPAM 5 MG PO TABS: 5.0000 mg | ORAL_TABLET | ORAL | Status: DC | PRN

## 2011-09-12 MED ORDER — AMLODIPINE BESYLATE 2.5 MG PO TABS: 2.5000 mg | ORAL_TABLET | Freq: Every day | ORAL | Status: AC

## 2011-09-12 MED ORDER — ISOSORBIDE MONONITRATE 15 MG HALF TABLET
15.0000 mg | ORAL_TABLET | Freq: Every day | ORAL | Status: DC
  Administered 2011-09-12: 15 mg via ORAL
  Filled 2011-09-12 (×2): qty 1

## 2011-09-12 NOTE — Progress Notes (Signed)
Utilization review completed.  

## 2011-09-12 NOTE — Discharge Summary (Signed)
Physician Discharge Summary  Patient ID: Lindsay [name] MRN: [id number] DOB/AGE: 71-Jun-1942 71 y.o.  Admit date: 09/11/2011 Discharge date: 09/12/2011  Admission Diagnoses: Chest pain at rest CAD  Hyperlipidemia  Weakness  Discharge Diagnoses:  Principal Problem:  *Chest pain at rest CAD  Hyperlipidemia  Weakness   Discharged Condition: good  Hospital Course: 71 years old black female from [place] with known CAD and S/P stent had recurrent chest pain. Her cardiac enzymes were normal. She underwent Cardiac catheterization that showed patent stent in LAD and moderate disease in LCX and RCA. She than reported running out of medications for 3-6 months due to monetary reasons.  Consults: cardiology  Significant Diagnostic Studies: labs: Normal CBC and electrolytes. Normal cardiac enzymes. Borderline BUN/Cr. Cardiac catheterization with patent LAD stent and unchanged moderate disease in LCX and Ostium of RCA.  Treatments: cardiac meds: Plavix, Imdur.  Discharge Exam: Blood pressure 116/72, pulse 60, temperature 98.8 F (37.1 C), temperature source Oral, resp. rate 18, height 5\' 9"  (1.753 m), weight 83.915 kg (185 lb), SpO2 100.00%.  HEENT: [place]/AT, Eyes-Brown, PERL, EOMI, Conjunctiva-Pink, Sclera-Non-icteric  Neck: No JVD, No bruit, Trachea midline.  Lungs: Clear, Bilateral.  Cardiac: Regular rhythm, normal S1 and S2, no S3.  Abdomen: Soft, non-tender.  Extremities: No edema present. No cyanosis. No clubbing. Right groin stable. CNS: AxOx3, Cranial nerves grossly intact, moves all 4 extremities. Right handed.  Skin: Warm and dry.   Disposition: 01-Home or Self Care   Medication List  As of 09/12/2011  7:13 PM   TAKE these medications         aspirin EC 81 MG tablet   Take 81 mg by mouth daily.      clopidogrel 75 MG tablet   Commonly known as: PLAVIX   Take 1 tablet (75 mg total) by mouth daily with breakfast.      nitroGLYCERIN 0.4 MG SL tablet   Commonly known  as: NITROSTAT   Place 0.4 mg under the tongue every 5 (five) minutes x 3 doses as needed. For chest pain      TYLENOL 500 MG tablet   Generic drug: acetaminophen   Take 500 mg by mouth every 6 (six) hours as needed. For pain           Follow-up Information    Follow up with [facility], MD. Schedule an appointment as soon as possible for a visit in 1 week.   Contact information:   [address] [place] [address] [phone]          Signed: Ricki Rodriguez 09/12/2011, 7:13 PM

## 2011-09-12 NOTE — CV Procedure (Signed)
PROCEDURE:  Left heart catheterization with selective coronary angiography, left ventriculogram.  CLINICAL HISTORY:  This is a 71 years old black female from [place] has known CAD and recurrent chest pain.  The risks, benefits, and details of the procedure were explained to the patient.  The patient verbalized understanding and wanted to proceed.  Informed written consent was obtained.  PROCEDURE TECHNIQUE:  The patient was approached from the right femoral artery using a 5 French short sheath.  Left coronary angiography was done using a Judkins L4 guide catheter.  Right coronary angiography was done using a Judkins R4 guide catheter.  Left ventriculography was done using a pigtail catheter.    CONTRAST:  Total of 50 cc.  COMPLICATIONS:  None.  At the end of the procedure a manual device was used for hemostasis.    HEMODYNAMICS:  Aortic pressure was 127/72; LV pressure was 139/5 ; LVEDP 13.  There was no gradient between the left ventricle and aorta.    ANGIOGRAM/CORONARY ARTERIOGRAM:   The left main coronary artery is unremarkable.  The left anterior descending artery is patent. Proximal stent has in-stent 20 % concentric stenosis.  The left circumflex artery is has proximal 60 % stenosis. OM branch has proximal 40 % stenosis.  The right coronary artery is dominant and has 50 % osteal and mild diffuse disease.  LEFT VENTRICULOGRAM:  Left ventricular angiogram was done in the 30 RAO projection and revealed normal left ventricular wall motion and systolic function with an estimated ejection fraction of 60 %.  LVEDP was 13 mmHg.  IMPRESSION OF HEART CATHETERIZATION:   1. Normal left main coronary artery. 2. Mild disease of left anterior descending artery and its branches. Patent proximal stent with mild disease. 3. Moderate disease of left circumflex artery and mild OM disease. 4. Moderate right coronary artery disease. 5. Normal left ventricular systolic function.  LVEDP 13 mmHg.   Ejection fraction 60%.  RECOMMENDATION:   Medical treatment for now.

## 2011-09-12 NOTE — Progress Notes (Signed)
Subjective:  Feeling little better. Worried about recurrent chest pain. Known history of CAD.  Objective:  Vital Signs in the last 24 hours: Temp:  [97.8 F (36.6 C)-98.8 F (37.1 C)] 97.8 F (36.6 C) (05/17 0500) Pulse Rate:  [61-62] 62  (05/17 0500) Cardiac Rhythm:  [-] Atrial paced (05/16 1950) Resp:  [18-20] 18  (05/17 0500) BP: (110-126)/(69-76) 110/76 mmHg (05/17 0500) SpO2:  [100 %] 100 % (05/17 0500) Weight:  [83.915 kg (185 lb)-86.183 kg (190 lb)] 83.915 kg (185 lb) (05/17 0500)  Physical Exam: BP Readings from Last 1 Encounters:  09/12/11 110/76    Wt Readings from Last 1 Encounters:  09/12/11 83.915 kg (185 lb)    Weight change:   HEENT: [place]/AT, Eyes-Brown, PERL, EOMI, Conjunctiva-Pink, Sclera-Non-icteric Neck: No JVD, No bruit, Trachea midline. Lungs:  Clear, Bilateral. Cardiac:  Regular rhythm, normal S1 and S2, no S3.  Abdomen:  Soft, non-tender. Extremities:  No edema present. No cyanosis. No clubbing. CNS: AxOx3, Cranial nerves grossly intact, moves all 4 extremities. Right handed. Skin: Warm and dry.   Intake/Output from previous day:      Lab Results: BMET    Component Value Date/Time   NA 140 01/17/2011 0623   K 3.6 01/17/2011 0623   CL 109 01/17/2011 0623   CO2 23 01/17/2011 0623   GLUCOSE 87 01/17/2011 0623   BUN 15 01/17/2011 0623   CREATININE 1.25* 01/17/2011 0623   CALCIUM 9.2 01/17/2011 0623   GFRNONAA 42* 01/17/2011 0623   GFRAA 51* 01/17/2011 0623   CBC    Component Value Date/Time   WBC 6.0 09/11/2011 1506   RBC 3.93 09/11/2011 1506   HGB 12.6 09/11/2011 1506   HCT 36.5 09/11/2011 1506   PLT 202 09/11/2011 1506   MCV 92.9 09/11/2011 1506   MCH 32.1 09/11/2011 1506   MCHC 34.5 09/11/2011 1506   RDW 12.9 09/11/2011 1506   LYMPHSABS 2.8 09/11/2011 1506   MONOABS 0.3 09/11/2011 1506   EOSABS 0.3 09/11/2011 1506   BASOSABS 0.0 09/11/2011 1506   CARDIAC ENZYMES Lab Results  Component Value Date   CKTOTAL 76 09/12/2011   CKMB 2.1 09/12/2011   TROPONINI <0.30 09/12/2011    Assessment/Plan:  Patient Active Hospital Problem List: Chest pain at rest (09/11/2011) CAD  Hyperlipidemia  Weakness  C. cath today. Blood work.   LOS: 1 day    Orpah Cobb  MD  09/12/2011, 7:17 AM

## 2011-09-12 NOTE — Progress Notes (Signed)
ANTICOAGULATION CONSULT NOTE - Follow Up Consult  Pharmacy Consult for IV Heparin Indication: chest pain/ACS  No Known Allergies  Patient Measurements: Height: 5\' 9"  (175.3 cm) Weight: 185 lb (83.915 kg) IBW/kg (Calculated) : 66.2  Heparin Dosing Weight: 84 kg  Vital Signs: Temp: 97.8 F (36.6 C) (05/17 0500) Temp src: Oral (05/17 0500) BP: 110/76 mmHg (05/17 0500) Pulse Rate: 62  (05/17 0500)  Labs:  Basename 09/12/11 0703 09/12/11 0156 09/11/11 2239 09/11/11 1917 09/11/11 1506  HGB 13.0 -- -- -- 12.6  HCT 37.6 -- -- -- 36.5  PLT 211 -- -- -- 202  APTT -- -- -- -- --  LABPROT 13.4 -- -- -- 14.3  INR 1.00 -- -- -- 1.09  HEPARINUNFRC 0.67 -- 0.55 -- --  CREATININE 1.26* -- -- -- --  CKTOTAL -- 76 -- 90 89  CKMB -- 2.1 -- 2.1 2.0  TROPONINI -- <0.30 -- <0.30 <0.30    Estimated Creatinine Clearance: 48.1 ml/min (by C-G formula based on Cr of 1.26).   Medications:  Infusions:    . sodium chloride    . heparin 1,000 Units/hr (09/11/11 1547)   Assessment: 70 YOF admitted 09/11/11 with chest pain and r/o MI on IV heparin. HL is therapeutic at 1000 units/hr. CBC is stable and within normal limits. No bleeding re-ported. Plan for cath today.  Goal of Therapy:  Heparin level 0.3-0.7 units/ml Monitor platelets by anticoagulation protocol: Yes   Plan:  1. Continue IV heparin at 1000 units/hr (10 ml/hr). 2. Will follow-up plans post-CATH.   Link Snuffer, PharmD, BCPS Clinical Pharmacist [phone] 09/12/2011,8:08 AM

## 2011-09-13 NOTE — Progress Notes (Signed)
Pt has already been discharged by attending physician but stayed overnight because there is nobody at her house, tonight and she doesn't have the key. She will leave first thing in the morning. Dr Algie Coffer was notified.

## 2012-01-26 ENCOUNTER — Ambulatory Visit (INDEPENDENT_AMBULATORY_CARE_PROVIDER_SITE_OTHER): Payer: Federal, State, Local not specified - PPO | Admitting: *Deleted

## 2012-01-26 ENCOUNTER — Encounter: Payer: Self-pay | Admitting: Internal Medicine

## 2012-01-26 DIAGNOSIS — I495 Sick sinus syndrome: Secondary | ICD-10-CM

## 2012-01-26 LAB — PACEMAKER DEVICE OBSERVATION
AL IMPEDENCE PM: 544 Ohm
BAMS-0001: 170 {beats}/min
BATTERY VOLTAGE: 3 V
VENTRICULAR PACING PM: 1.37

## 2012-01-26 NOTE — Progress Notes (Signed)
PPM check 

## 2012-02-03 ENCOUNTER — Encounter (HOSPITAL_COMMUNITY): Payer: Self-pay | Admitting: *Deleted

## 2012-02-03 ENCOUNTER — Emergency Department (INDEPENDENT_AMBULATORY_CARE_PROVIDER_SITE_OTHER)
Admission: EM | Admit: 2012-02-03 | Discharge: 2012-02-03 | Disposition: A | Discharge status: Home / Self Care | Payer: Self-pay | Source: Home / Self Care | Attending: Family Medicine | Admitting: Family Medicine

## 2012-02-03 DIAGNOSIS — R51 Headache: Secondary | ICD-10-CM

## 2012-02-03 DIAGNOSIS — T148XXA Other injury of unspecified body region, initial encounter: Secondary | ICD-10-CM

## 2012-02-03 MED ORDER — DIAZEPAM 5 MG PO TABS: 5.0000 mg | ORAL_TABLET | Freq: Four times a day (QID) | ORAL | Status: DC | PRN

## 2012-02-03 MED ORDER — TRAMADOL HCL 50 MG PO TABS: 50.0000 mg | ORAL_TABLET | Freq: Four times a day (QID) | ORAL | Status: DC | PRN

## 2012-02-03 NOTE — ED Notes (Signed)
Pt reports hitting head last Wednesday at [employer] with no loc, states that she still has pain and is seeing waves of color with pain into neck

## 2012-02-03 NOTE — ED Provider Notes (Signed)
History     CSN: [id number]  Arrival date & time 02/03/12  1101   First MD Initiated Contact with Patient 02/03/12 1205      Chief Complaint  Patient presents with  . Head Injury    (Consider location/radiation/quality/duration/timing/severity/associated sxs/prior treatment) Patient is a 71 y.o. female presenting with head injury. The history is provided by the patient.  Head Injury  The incident occurred more than 2 days ago (6 days ago, hit head on pole at [employer] after browsing refr[employer]gerated section). She came to the ER via walk-in. The injury mechanism was a direct blow. There was no loss of consciousness. There was no blood loss. The quality of the pain is described as dull. The pain is mild. The pain has been constant since the injury. Associated symptoms include blurred vision. Pertinent negatives include no numbness, no vomiting, no tinnitus, patient does not experience disorientation, no weakness and no memory loss. She has tried nothing for the symptoms. Improvement on treatment: n/a.    Past Medical History  Diagnosis Date  . Sinus bradycardia     s/p pacer in 6.2011  . CAD (coronary artery disease)     s/p BMS to LAD in 06; Drug-eluting stent for in-stent restenosis 2007; modest residual disease cath 2011  . MI (myocardial infarction) 2/2  . 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 Anxiety   . HLD (hyperlipidemia)   . Atrial fibrillation     Identified on pacemaker   . Refusal of blood transfusions as patient is Jehovah's Witness   . Shortness of breath   . Complication of anesthesia     difficult waking up "  . Pacemaker     Past Surgical History  Procedure Date  . Angioplasty of coronary vessel   . Insert / replace / remove pacemaker   . Hernia repair   . Cesarean section     Family History  Problem Relation Age of Onset  . Family history unknown: Yes    History  Substance Use Topics  . Smoking status: Never Smoker   . Smokeless tobacco: Never Used    . Alcohol Use: No    OB History    Grav Para Term Preterm Abortions TAB SAB Ect Mult Living                  Review of Systems  Constitutional: Negative.   HENT: Positive for neck pain and neck stiffness. Negative for ear pain, facial swelling and tinnitus.   Eyes: Positive for blurred vision and visual disturbance. Negative for pain and redness.  Respiratory: Negative.   Cardiovascular: Negative.   Gastrointestinal: Negative for vomiting.  Neurological: Positive for dizziness and headaches. Negative for syncope, speech difficulty, weakness and numbness.  Psychiatric/Behavioral: Negative for memory loss.    Allergies  Review of patient's allergies indicates no known allergies.  Home Medications   Current Outpatient Rx  Name Route Sig Dispense Refill  . ACETAMINOPHEN 500 MG PO TABS Oral Take 500 mg by mouth every 6 (six) hours as needed. For pain    . AMLODIPINE BESYLATE 2.5 MG PO TABS Oral Take 1 tablet (2.5 mg total) by mouth daily. 30 tablet 1  . ASPIRIN EC 81 MG PO TBEC Oral Take 81 mg by mouth daily.    Marland Kitchen CLOPIDOGREL BISULFATE 75 MG PO TABS Oral Take 1 tablet (75 mg total) by mouth daily with breakfast. 30 tablet 1  . DIAZEPAM 5 MG PO TABS Oral Take 1 tablet (5 mg total)  by mouth every 6 (six) hours as needed for anxiety. 20 tablet 0  . NITROGLYCERIN 0.4 MG SL SUBL Sublingual Place 0.4 mg under the tongue every 5 (five) minutes x 3 doses as needed. For chest pain    . TRAMADOL HCL 50 MG PO TABS Oral Take 1 tablet (50 mg total) by mouth every 6 (six) hours as needed for pain. 30 tablet 0    BP 129/76  Pulse 76  Temp 98 F (36.7 C) (Oral)  Resp 18  SpO2 100%  Physical Exam  Nursing note and vitals reviewed. Constitutional: She is oriented to person, place, and time. Vital signs are normal. She appears well-developed and well-nourished. She is active and cooperative.  HENT:  Head: Normocephalic and atraumatic.  Right Ear: Hearing, tympanic membrane, external ear and  ear canal normal.  Left Ear: Hearing, tympanic membrane, external ear and ear canal normal.  Nose: Nose normal. Right sinus exhibits no maxillary sinus tenderness and no frontal sinus tenderness. Left sinus exhibits no maxillary sinus tenderness and no frontal sinus tenderness.  Mouth/Throat: Uvula is midline, oropharynx is clear and moist and mucous membranes are normal.  Eyes: Conjunctivae normal and EOM are normal. Pupils are equal, round, and reactive to light. No scleral icterus.  Neck: Trachea normal and normal range of motion. Neck supple. Muscular tenderness present.    Cardiovascular: Normal rate, regular rhythm, normal heart sounds, intact distal pulses and normal pulses.   Pulmonary/Chest: Effort normal and breath sounds normal.  Musculoskeletal:       Right shoulder: She exhibits decreased range of motion and tenderness. She exhibits no bony tenderness, no swelling, no effusion, no crepitus, no deformity, no spasm and normal strength.       Left shoulder: Normal.       Cervical back: Normal.  Neurological: She is alert and oriented to person, place, and time. She has normal strength. She is not disoriented. No cranial nerve deficit or sensory deficit. GCS eye subscore is 4. GCS verbal subscore is 5. GCS motor subscore is 6.  Skin: Skin is warm and dry.  Psychiatric: She has a normal mood and affect. Her speech is normal and behavior is normal. Judgment and thought content normal. Cognition and memory are normal.    ED Course  Procedures (including critical care time)  Labs Reviewed - No data to display No results found.   1. Headache   2. Muscle strain       MDM  Medications as prescribed, follow up with primary care provider if symptoms persist.        Johnsie Kindred, NP 02/03/12 1312

## 2012-02-03 NOTE — ED Provider Notes (Signed)
Medical screening examination/treatment/procedure(s) were performed by resident physician or non-physician practitioner and as supervising physician I was immediately available for consultation/collaboration.   Zayley Arras DOUGLAS MD.    Bahar Shelden D Mirielle Byrum, MD 02/03/12 2052 

## 2012-03-17 ENCOUNTER — Telehealth: Payer: Self-pay | Admitting: Internal Medicine

## 2012-03-17 ENCOUNTER — Ambulatory Visit (INDEPENDENT_AMBULATORY_CARE_PROVIDER_SITE_OTHER): Payer: Federal, State, Local not specified - PPO | Admitting: Internal Medicine

## 2012-03-17 ENCOUNTER — Encounter: Payer: Self-pay | Admitting: Internal Medicine

## 2012-03-17 VITALS — BP 138/70 | HR 63 | Ht 69.6 in | Wt 190.0 lb

## 2012-03-17 DIAGNOSIS — I10 Essential (primary) hypertension: Secondary | ICD-10-CM

## 2012-03-17 DIAGNOSIS — I2510 Atherosclerotic heart disease of native coronary artery without angina pectoris: Secondary | ICD-10-CM

## 2012-03-17 DIAGNOSIS — I4891 Unspecified atrial fibrillation: Secondary | ICD-10-CM

## 2012-03-17 DIAGNOSIS — Z950 Presence of cardiac pacemaker: Secondary | ICD-10-CM

## 2012-03-17 LAB — PACEMAKER DEVICE OBSERVATION
AL THRESHOLD: 1 V
ATRIAL PACING PM: 76.31
BAMS-0001: 170 {beats}/min
RV LEAD AMPLITUDE: 11.2 mv
RV LEAD THRESHOLD: 1 V

## 2012-03-17 NOTE — Telephone Encounter (Signed)
Pt needs report medical condition to dr Algie Coffer, she is there now and they do not have the report, pls fax asap to [phone]

## 2012-03-17 NOTE — Progress Notes (Signed)
  HPI  Lindsay [name] is a 71 y.o. 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  She has no specific cardiovascular complaints. She overall does has a generalized sense of fatigue. She saw Dr. Algie Coffer just a couple of weeks ago.  She was noted to have atrial fibrillation on device interrogation.    Past Medical History  Diagnosis Date  . Sinus bradycardia     s/p pacer in 6.2011  . CAD (coronary artery disease)     s/p BMS to LAD in 06; Drug-eluting stent for in-stent restenosis 2007; modest residual disease cath 2011  . MI (myocardial infarction) 2/2  . 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 Anxiety   . HLD (hyperlipidemia)   . Atrial fibrillation     Identified on pacemaker   . Refusal of blood transfusions as patient is Jehovah's Witness   . Shortness of breath   . Complication of anesthesia     difficult waking up "  . Pacemaker     Past Surgical History  Procedure Date  . Angioplasty of coronary vessel   . Insert / replace / remove pacemaker   . Hernia repair   . Cesarean section     Current Outpatient Prescriptions  Medication Sig Dispense Refill  . acetaminophen (TYLENOL) 500 MG tablet Take 500 mg by mouth every 6 (six) hours as needed. For pain      . amLODipine (NORVASC) 2.5 MG tablet Take 1 tablet (2.5 mg total) by mouth daily.  30 tablet  1  . aspirin EC 81 MG tablet Take 81 mg by mouth daily.      . clopidogrel (PLAVIX) 75 MG tablet Take 1 tablet (75 mg total) by mouth daily with breakfast.  30 tablet  1  . diazepam (VALIUM) 5 MG tablet Take 1 tablet (5 mg total) by mouth every 6 (six) hours as needed for anxiety.  20 tablet  0  . nitroGLYCERIN (NITROSTAT) 0.4 MG SL tablet Place 0.4 mg under the tongue every 5 (five) minutes x 3 doses as needed. For chest pain      . traMADol (ULTRAM) 50 MG tablet Take 1 tablet (50 mg total) by mouth every 6 (six) hours as needed for pain.  30 tablet  0     No Known Allergies  Review of Systems negative except from HPI and PMH  Physical Exam BP 138/70  Pulse 63  Ht 5' 9.6" (1.768 m)  Wt 190 lb (86.183 kg)  BMI 27.58 kg/m2  SpO2 99% Well developed and well nourished    No acute distress HENT normal E scleral and icterus clear Neck Supple JVP flat;  Device pocket well healed; without hematoma or erythema Clear to ausculation Regular rate and rhythm, no murmurs gallops or rub Soft with active bowel sounds No clubbing cyanosis none Edema Alert and oriented, grossly normal motor and sensory function Skin Warm and Dry   Assessment and  Plan

## 2012-03-17 NOTE — Assessment & Plan Note (Signed)
The patient has recurrent episodes of atrial fibrillation lasting more than an hour identified in her device. Her thromboembolic risk profile includes hypertension, coronary disease age and gender and as such she should probably be on oral anticoagulants instead of aspirin/Plavix. I will discuss with Dr. Arnetha Courser whether she can be switched. The one that he would be if she had a first generation drug-eluting stent placed in the setting of an acute MI that would dictate a need for ongoing antiplatelet therapy. Her rate was well controlled.

## 2012-03-17 NOTE — Patient Instructions (Signed)
Your physician wants you to follow-up in: 6 months in the device clinic with Belenda Cruise and [place].  You will receive a reminder letter in the mail two months in advance. If you don't receive a letter, please call our office to schedule the follow-up appointment.   Your physician wants you to follow-up in: 12 months with Dr. Graciela Husbands. You will receive a reminder letter in the mail two months in advance. If you don't receive a letter, please call our office to schedule the follow-up appointment.

## 2012-03-17 NOTE — Assessment & Plan Note (Signed)
As above.

## 2012-03-17 NOTE — Assessment & Plan Note (Signed)
Reasonably controlled 

## 2012-04-07 ENCOUNTER — Emergency Department (INDEPENDENT_AMBULATORY_CARE_PROVIDER_SITE_OTHER)
Admission: EM | Admit: 2012-04-07 | Discharge: 2012-04-07 | Disposition: A | Discharge status: Home / Self Care | Payer: Federal, State, Local not specified - PPO | Source: Home / Self Care | Attending: Family Medicine | Admitting: Family Medicine

## 2012-04-07 ENCOUNTER — Encounter (HOSPITAL_COMMUNITY): Payer: Self-pay | Admitting: Emergency Medicine

## 2012-04-07 DIAGNOSIS — H6090 Unspecified otitis externa, unspecified ear: Secondary | ICD-10-CM

## 2012-04-07 DIAGNOSIS — H60399 Other infective otitis externa, unspecified ear: Secondary | ICD-10-CM

## 2012-04-07 DIAGNOSIS — J069 Acute upper respiratory infection, unspecified: Secondary | ICD-10-CM

## 2012-04-07 MED ORDER — CIPROFLOXACIN-DEXAMETHASONE 0.3-0.1 % OT SUSP: 4.0000 [drp] | Freq: Two times a day (BID) | OTIC | Status: AC

## 2012-04-07 NOTE — ED Provider Notes (Signed)
History     CSN: [id number]  Arrival date & time 04/07/12  1003   First MD Initiated Contact with Patient 04/07/12 1045      Chief Complaint  Patient presents with  . URI    (Consider location/radiation/quality/duration/timing/severity/associated sxs/prior treatment) Patient is a 71 y.o. female presenting with URI. The history is provided by the patient.  URI The primary symptoms include cough.  The following treatments were addressed: Acetaminophen was effective.  Anjeanette is a 71 y.o. female who complains of onset of cold symptoms for 4 days  + sore throat + cough, non productive No pleuritic pain No wheezing + nasal congestion + post-nasal drainage No sinus pain/pressure No voice changes No chest congestion No itchy/red eyes No earache No hemoptysis No SOB + chills/sweats No fever No nausea No vomiting No abdominal pain No diarrhea No skin rashes No fatigue + myalgias No headache  + ill contacts  Past Medical History  Diagnosis Date  . Sinus bradycardia     s/p pacer in 6.2011  . CAD (coronary artery disease)     s/p BMS to LAD in 06; Drug-eluting stent for in-stent restenosis 2007; modest residual disease cath 2011  . MI (myocardial infarction) 2/2  . 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 Anxiety   . HLD (hyperlipidemia)   . Atrial fibrillation     Identified on pacemaker   . Refusal of blood transfusions as patient is Jehovah's Witness   . Shortness of breath   . Complication of anesthesia     difficult waking up "  . Pacemaker     Past Surgical History  Procedure Date  . Angioplasty of coronary vessel   . Insert / replace / remove pacemaker   . Hernia repair   . Cesarean section     History reviewed. No pertinent family history.  History  Substance Use Topics  . Smoking status: Never Smoker   . Smokeless tobacco: Never Used  . Alcohol Use: No    OB History    Grav Para Term Preterm Abortions TAB SAB Ect Mult Living           Review of Systems  Respiratory: Positive for cough.   All other systems reviewed and are negative.    Allergies  Review of patient's allergies indicates no known allergies.  Home Medications   Current Outpatient Rx  Name  Route  Sig  Dispense  Refill  . ACETAMINOPHEN 500 MG PO TABS   Oral   Take 500 mg by mouth every 6 (six) hours as needed. For pain         . AMIODARONE HCL 200 MG PO TABS   Oral   Take 200 mg by mouth daily.         . ASPIRIN EC 81 MG PO TBEC   Oral   Take 81 mg by mouth daily.         Marland Kitchen AMLODIPINE BESYLATE 2.5 MG PO TABS   Oral   Take 1 tablet (2.5 mg total) by mouth daily.   30 tablet   1   . CIPROFLOXACIN-DEXAMETHASONE 0.3-0.1 % OT SUSP   Right Ear   Place 4 drops into the right ear 2 (two) times daily.   7.5 mL   0   . CLOPIDOGREL BISULFATE 75 MG PO TABS   Oral   Take 1 tablet (75 mg total) by mouth daily with breakfast.   30 tablet   1   . DIAZEPAM 5 MG PO  TABS   Oral   Take 1 tablet (5 mg total) by mouth every 6 (six) hours as needed for anxiety.   20 tablet   0   . NITROGLYCERIN 0.4 MG SL SUBL   Sublingual   Place 0.4 mg under the tongue every 5 (five) minutes x 3 doses as needed. For chest pain         . TRAMADOL HCL 50 MG PO TABS   Oral   Take 1 tablet (50 mg total) by mouth every 6 (six) hours as needed for pain.   30 tablet   0     BP 111/74  Pulse 65  Temp 99.1 F (37.3 C) (Oral)  Resp 18  SpO2 100%  Physical Exam  Nursing note and vitals reviewed. Constitutional: She is oriented to person, place, and time. Vital signs are normal. She appears well-developed and well-nourished. She is active and cooperative.  HENT:  Head: Normocephalic.  Right Ear: Tympanic membrane normal. There is drainage and swelling.  Left Ear: Tympanic membrane and external ear normal.  Nose: Rhinorrhea present.  Mouth/Throat: Uvula is midline, oropharynx is clear and moist and mucous membranes are normal.  Eyes:  Conjunctivae normal are normal. Pupils are equal, round, and reactive to light. No scleral icterus.  Neck: Trachea normal, normal range of motion and full passive range of motion without pain. Neck supple. Carotid bruit is not present.  Cardiovascular: Normal rate, regular rhythm, normal heart sounds, intact distal pulses and normal pulses.   Pulmonary/Chest: Effort normal and breath sounds normal.  Lymphadenopathy:       Head (right side): No submental, no submandibular, no tonsillar, no preauricular, no posterior auricular and no occipital adenopathy present.       Head (left side): No submental, no submandibular, no tonsillar, no preauricular, no posterior auricular and no occipital adenopathy present.    She has no cervical adenopathy.  Neurological: She is alert and oriented to person, place, and time. She has normal strength. No cranial nerve deficit or sensory deficit. GCS eye subscore is 4. GCS verbal subscore is 5. GCS motor subscore is 6.  Skin: Skin is warm and dry. No rash noted.  Psychiatric: She has a normal mood and affect. Her speech is normal and behavior is normal. Judgment and thought content normal. Cognition and memory are normal.    ED Course  Procedures (including critical care time)  Labs Reviewed - No data to display No results found.   1. Otitis externa   2. URI (upper respiratory infection)       MDM  Increase fluid intake, rest.  No antibiotics indicated.  Begin expectorant/decongestant, topical decongestant, saline nasal spray and/or saline irrigation, and cough suppressant at bedtime. Antihistamines of your choice (Claritin or Zyrtec).  Tylenol or Motrin for fever/discomfort.  Followup with PCP if not improving 7 to 10 days.  Ear drops as prescribed.     Johnsie Kindred, NP 04/07/12 1058

## 2012-04-07 NOTE — ED Provider Notes (Signed)
Medical screening examination/treatment/procedure(s) were performed by non-physician practitioner and as supervising physician I was immediately available for consultation/collaboration.   [facility]; MD   Sharin Grave, MD 04/07/12 [phone]

## 2012-04-07 NOTE — ED Notes (Signed)
Reports cough, congestion, chills, sore throat and headache.  Denies diarrhea, vomiting.

## 2012-04-19 ENCOUNTER — Encounter (HOSPITAL_COMMUNITY): Payer: Self-pay | Admitting: *Deleted

## 2012-04-19 ENCOUNTER — Emergency Department (INDEPENDENT_AMBULATORY_CARE_PROVIDER_SITE_OTHER)
Admission: EM | Admit: 2012-04-19 | Discharge: 2012-04-19 | Disposition: A | Discharge status: Home / Self Care | Payer: Self-pay | Source: Home / Self Care | Attending: Emergency Medicine | Admitting: Emergency Medicine

## 2012-04-19 DIAGNOSIS — H6090 Unspecified otitis externa, unspecified ear: Secondary | ICD-10-CM

## 2012-04-19 DIAGNOSIS — J019 Acute sinusitis, unspecified: Secondary | ICD-10-CM

## 2012-04-19 DIAGNOSIS — J209 Acute bronchitis, unspecified: Secondary | ICD-10-CM

## 2012-04-19 MED ORDER — BENZONATATE 200 MG PO CAPS: 200.0000 mg | ORAL_CAPSULE | Freq: Three times a day (TID) | ORAL | Status: DC | PRN

## 2012-04-19 MED ORDER — NEOMYCIN-POLYMYXIN-HC 3.5-10000-1 OT SUSP: 3.0000 [drp] | Freq: Four times a day (QID) | OTIC | Status: DC

## 2012-04-19 MED ORDER — ONDANSETRON HCL 4 MG/2ML IJ SOLN
INTRAMUSCULAR | Status: AC
  Filled 2012-04-19: qty 2

## 2012-04-19 MED ORDER — HYDROMORPHONE HCL PF 1 MG/ML IJ SOLN
INTRAMUSCULAR | Status: AC
  Filled 2012-04-19: qty 1

## 2012-04-19 MED ORDER — AMOXICILLIN 500 MG PO CAPS: 1000.0000 mg | ORAL_CAPSULE | Freq: Three times a day (TID) | ORAL | Status: DC

## 2012-04-19 NOTE — ED Notes (Signed)
Pt  Reports  Symptoms  Of a  Lingering  Cough    Congestion  Symptoms  Which  She  Has  Had  For    Almost  3  Weeks    =  Pt  Also  Reports  Symptoms  Of  r  Earache  As  Well   Pt     Is  Sitting  Upright on the  Exam table  She  Is  Speaking in  Com[plete  sentances   And  Is  In no  distress

## 2012-04-19 NOTE — ED Provider Notes (Signed)
Chief Complaint  Patient presents with  . Cough    History of Present Illness:   Lindsay [name] is a 71 year old female with a three-week history of URI symptoms. She's had sore throat, chills, cough which is sometimes dry and sometimes productive of green sputum, chest tightness, wheezing, aching in her chest, nasal congestion with white drainage, headache, and nausea. Her throat feels like it's on fire. She also has had pain in her right ear going on for about 3 weeks. She was seen on December 11 for the ear pain and given some Ciprodex ear drops but she couldn't afford these so she's been using peroxide instead.  Review of Systems:  Other than noted above, the patient denies any of the following symptoms. Systemic:  No fever, chills, sweats, fatigue, myalgias, headache, or anorexia. Eye:  No redness, pain or drainage. ENT:  No earache, ear congestion, nasal congestion, sneezing, rhinorrhea, sinus pressure, sinus pain, post nasal drip, or sore throat. Lungs:  No cough, sputum production, wheezing, shortness of breath, or chest pain. GI:  No abdominal pain, nausea, vomiting, or diarrhea.  PMFSH:  Past medical history, family history, social history, meds, and allergies were reviewed.  Physical Exam:   Vital signs:  BP 148/88  Pulse 72  Temp 99.4 F (37.4 C) (Oral)  Resp 18  SpO2 98% General:  Alert, in no distress. Eye:  No conjunctival injection or drainage. Lids were normal. ENT:  TMs and canals were normal, without erythema or inflammation.  Nasal mucosa was clear and uncongested, without drainage.  Mucous membranes were moist.  Pharynx was clear, without exudate or drainage.  There were no oral ulcerations or lesions. Neck:  Supple, no adenopathy, tenderness or mass. Lungs:  No respiratory distress.  Lungs were clear to auscultation, without wheezes, rales or rhonchi.  Breath sounds were clear and equal bilaterally.  Heart:  Regular rhythm, without gallops, murmers or rubs. Skin:  Clear,  warm, and dry, without rash or lesions.  Assessment:  The primary encounter diagnosis was Acute sinusitis. Diagnoses of Acute bronchitis and Otitis externa were also pertinent to this visit.  Plan:   1.  The following meds were prescribed:   New Prescriptions   AMOXICILLIN (AMOXIL) 500 MG CAPSULE    Take 2 capsules (1,000 mg total) by mouth 3 (three) times daily.   BENZONATATE (TESSALON) 200 MG CAPSULE    Take 1 capsule (200 mg total) by mouth 3 (three) times daily as needed for cough.   NEOMYCIN-POLYMYXIN-HYDROCORTISONE (CORTISPORIN) 3.5-10000-1 OTIC SUSPENSION    Place 3 drops into the right ear 4 (four) times daily.   2.  The patient was instructed in symptomatic care and handouts were given. 3.  The patient was told to return if becoming worse in any way, if no better in 3 or 4 days, and given some red flag symptoms that would indicate earlier return.   Reuben Likes, MD 04/19/12 2126

## 2012-09-14 ENCOUNTER — Ambulatory Visit (INDEPENDENT_AMBULATORY_CARE_PROVIDER_SITE_OTHER): Payer: Federal, State, Local not specified - PPO | Admitting: Cardiology

## 2012-09-14 ENCOUNTER — Encounter: Payer: Self-pay | Admitting: Cardiology

## 2012-09-14 ENCOUNTER — Encounter: Payer: Self-pay | Admitting: Internal Medicine

## 2012-09-14 VITALS — Ht 68.5 in | Wt 195.4 lb

## 2012-09-14 DIAGNOSIS — I495 Sick sinus syndrome: Secondary | ICD-10-CM

## 2012-09-14 DIAGNOSIS — Z950 Presence of cardiac pacemaker: Secondary | ICD-10-CM

## 2012-09-14 LAB — PACEMAKER DEVICE OBSERVATION
AL AMPLITUDE: 1.6 mv
BAMS-0001: 170 {beats}/min
BATTERY VOLTAGE: 3 V
RV LEAD IMPEDENCE PM: 448 Ohm
VENTRICULAR PACING PM: 0.6

## 2012-09-14 NOTE — Progress Notes (Signed)
Routine PPM check/device clinic visit. Normal PPM function. No programming changes made. See PaceArt report.

## 2012-09-14 NOTE — Patient Instructions (Addendum)
Your physician wants you to follow-up in: November 2014 with Dr. Graciela Husbands. You will receive a reminder letter in the mail two months in advance. If you don't receive a letter, please call our office to schedule the follow-up appointment.  Your physician recommends that you continue on your current medications as directed. Please refer to the Current Medication list given to you today.

## 2013-03-29 ENCOUNTER — Encounter: Payer: Self-pay | Admitting: *Deleted

## 2013-10-05 ENCOUNTER — Encounter: Payer: Self-pay | Admitting: *Deleted

## 2013-10-06 ENCOUNTER — Telehealth: Payer: Self-pay | Admitting: Cardiology

## 2013-10-06 NOTE — Telephone Encounter (Signed)
Certified letter sent 

## 2014-04-06 ENCOUNTER — Encounter (HOSPITAL_COMMUNITY): Payer: Self-pay | Admitting: Cardiovascular Disease
